# Patient Record
Sex: Female | Born: 1970
Health system: Southern US, Community
[De-identification: ages and names within clinical notes are randomized; demographics above are authoritative.]

## PROBLEM LIST (undated history)

## (undated) DIAGNOSIS — D649 Anemia, unspecified: Secondary | ICD-10-CM

## (undated) DIAGNOSIS — E669 Obesity, unspecified: Secondary | ICD-10-CM

## (undated) DIAGNOSIS — Z9889 Other specified postprocedural states: Secondary | ICD-10-CM

## (undated) DIAGNOSIS — K610 Anal abscess: Secondary | ICD-10-CM

## (undated) DIAGNOSIS — Z8719 Personal history of other diseases of the digestive system: Secondary | ICD-10-CM

## (undated) DIAGNOSIS — E282 Polycystic ovarian syndrome: Secondary | ICD-10-CM

## (undated) DIAGNOSIS — R112 Nausea with vomiting, unspecified: Secondary | ICD-10-CM

## (undated) DIAGNOSIS — K602 Anal fissure, unspecified: Secondary | ICD-10-CM

## (undated) DIAGNOSIS — I10 Essential (primary) hypertension: Secondary | ICD-10-CM

## (undated) HISTORY — DX: Polycystic ovarian syndrome: E28.2

## (undated) HISTORY — PX: OTHER SURGICAL HISTORY: SHX169

## (undated) HISTORY — PX: TUBAL LIGATION: SHX77

## (undated) HISTORY — PX: GASTRIC BYPASS: SHX52

## (undated) HISTORY — DX: Anemia, unspecified: D64.9

## (undated) HISTORY — PX: CHOLECYSTECTOMY: SHX55

## (undated) HISTORY — PX: TONSILLECTOMY: SUR1361

## (undated) HISTORY — DX: Personal history of other diseases of the digestive system: Z87.19

## (undated) HISTORY — DX: Anal fissure, unspecified: K60.2

## (undated) HISTORY — DX: Obesity, unspecified: E66.9

## (undated) HISTORY — PX: CARPAL TUNNEL RELEASE: SHX101

## (undated) HISTORY — DX: Anal abscess: K61.0

## (undated) HISTORY — PX: COSMETIC SURGERY: SHX468

## (undated) HISTORY — PX: NASAL SEPTUM SURGERY: SHX37

## (undated) HISTORY — PX: BREAST REDUCTION SURGERY: SHX8

## (undated) HISTORY — PX: EAR CYST EXCISION: SHX22

## (undated) SURGERY — Surgical Case
Anesthesia: *Unknown

---

## 1997-07-05 ENCOUNTER — Ambulatory Visit (HOSPITAL_BASED_OUTPATIENT_CLINIC_OR_DEPARTMENT_OTHER): Admission: RE | Admit: 1997-07-05 | Discharge: 1997-07-05 | Payer: Self-pay

## 1998-04-04 ENCOUNTER — Ambulatory Visit (HOSPITAL_COMMUNITY): Admission: RE | Admit: 1998-04-04 | Discharge: 1998-04-04 | Payer: Self-pay | Admitting: Obstetrics and Gynecology

## 1998-06-11 ENCOUNTER — Other Ambulatory Visit: Admission: RE | Admit: 1998-06-11 | Discharge: 1998-06-11 | Payer: Self-pay | Admitting: Obstetrics and Gynecology

## 1999-12-27 ENCOUNTER — Emergency Department (HOSPITAL_COMMUNITY): Admission: EM | Admit: 1999-12-27 | Discharge: 1999-12-27 | Payer: Self-pay | Admitting: Emergency Medicine

## 2000-02-18 ENCOUNTER — Other Ambulatory Visit: Admission: RE | Admit: 2000-02-18 | Discharge: 2000-02-18 | Payer: Self-pay | Admitting: Obstetrics and Gynecology

## 2001-05-26 ENCOUNTER — Other Ambulatory Visit: Admission: RE | Admit: 2001-05-26 | Discharge: 2001-05-26 | Payer: Self-pay | Admitting: Obstetrics and Gynecology

## 2001-06-26 ENCOUNTER — Encounter: Payer: Self-pay | Admitting: Emergency Medicine

## 2001-06-26 ENCOUNTER — Emergency Department (HOSPITAL_COMMUNITY): Admission: EM | Admit: 2001-06-26 | Discharge: 2001-06-26 | Payer: Self-pay | Admitting: Emergency Medicine

## 2002-09-11 ENCOUNTER — Other Ambulatory Visit: Admission: RE | Admit: 2002-09-11 | Discharge: 2002-09-11 | Payer: Self-pay | Admitting: Obstetrics and Gynecology

## 2003-05-14 ENCOUNTER — Emergency Department (HOSPITAL_COMMUNITY): Admission: EM | Admit: 2003-05-14 | Discharge: 2003-05-14 | Payer: Self-pay | Admitting: Emergency Medicine

## 2003-05-17 ENCOUNTER — Ambulatory Visit (HOSPITAL_COMMUNITY): Admission: RE | Admit: 2003-05-17 | Discharge: 2003-05-17 | Payer: Self-pay | Admitting: General Surgery

## 2003-05-20 ENCOUNTER — Encounter (INDEPENDENT_AMBULATORY_CARE_PROVIDER_SITE_OTHER): Payer: Self-pay | Admitting: Specialist

## 2003-05-20 ENCOUNTER — Observation Stay (HOSPITAL_COMMUNITY): Admission: RE | Admit: 2003-05-20 | Discharge: 2003-05-21 | Payer: Self-pay | Admitting: General Surgery

## 2004-02-17 ENCOUNTER — Ambulatory Visit: Payer: Self-pay | Admitting: Gastroenterology

## 2004-02-24 ENCOUNTER — Ambulatory Visit: Payer: Self-pay | Admitting: Gastroenterology

## 2004-03-26 ENCOUNTER — Ambulatory Visit: Payer: Self-pay | Admitting: Gastroenterology

## 2004-12-28 IMAGING — RF DG CHOLANGIOGRAM OPERATIVE
1 series · 2 of 2 positions shown · non-contrast
Comparison: none

CLINICAL DATA: Gallstones.
 OPERATIVE CHOLANGIOGRAM
 Injection is made through the cystic duct.  There is good visualization of the common hepatic and common bile ducts.  There is good excretion into the duodenum.  No filling defects are seen.  
 IMPRESSION
 Unremarkable operative cholangiogram.

[Series 1: run · 2 of 2 slices shown]
[im 1/2]
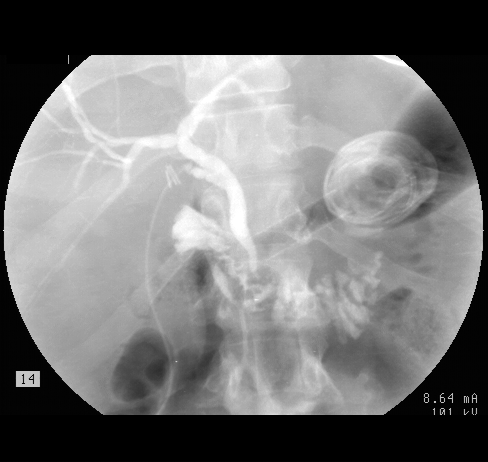
[im 2/2]
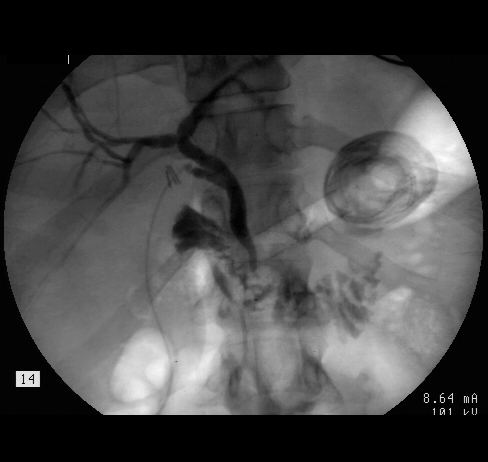

[2 of 2 positions shown; findings below may reference images not displayed]

## 2007-06-04 ENCOUNTER — Emergency Department (HOSPITAL_COMMUNITY): Admission: EM | Admit: 2007-06-04 | Discharge: 2007-06-04 | Payer: Self-pay | Admitting: Emergency Medicine

## 2008-05-02 ENCOUNTER — Observation Stay (HOSPITAL_COMMUNITY): Admission: EM | Admit: 2008-05-02 | Discharge: 2008-05-03 | Payer: Self-pay | Admitting: Emergency Medicine

## 2008-05-02 ENCOUNTER — Ambulatory Visit: Payer: Self-pay | Admitting: Cardiology

## 2008-05-08 ENCOUNTER — Ambulatory Visit: Payer: Self-pay

## 2008-05-28 ENCOUNTER — Ambulatory Visit: Payer: Self-pay | Admitting: Cardiology

## 2008-05-28 ENCOUNTER — Encounter: Payer: Self-pay | Admitting: Cardiology

## 2008-07-29 ENCOUNTER — Ambulatory Visit (HOSPITAL_BASED_OUTPATIENT_CLINIC_OR_DEPARTMENT_OTHER): Admission: RE | Admit: 2008-07-29 | Discharge: 2008-07-29 | Payer: Self-pay | Admitting: Orthopedic Surgery

## 2009-08-20 ENCOUNTER — Ambulatory Visit (HOSPITAL_COMMUNITY): Admission: RE | Admit: 2009-08-20 | Discharge: 2009-08-21 | Payer: Self-pay | Admitting: Neurosurgery

## 2010-02-12 ENCOUNTER — Emergency Department (HOSPITAL_COMMUNITY): Admission: EM | Admit: 2010-02-12 | Discharge: 2009-07-15 | Payer: Self-pay | Admitting: Emergency Medicine

## 2010-05-25 LAB — CBC
HCT: 37.2 % (ref 36.0–46.0)
Hemoglobin: 12.2 g/dL (ref 12.0–15.0)
MCHC: 32.8 g/dL (ref 30.0–36.0)
MCV: 83.9 fL (ref 78.0–100.0)
Platelets: 349 10*3/uL (ref 150–400)
RBC: 4.43 MIL/uL (ref 3.87–5.11)
RDW: 13.7 % (ref 11.5–15.5)
WBC: 9.5 10*3/uL (ref 4.0–10.5)

## 2010-05-25 LAB — MRSA CULTURE

## 2010-05-25 LAB — BASIC METABOLIC PANEL
BUN: 10 mg/dL (ref 6–23)
CO2: 30 mEq/L (ref 19–32)
Calcium: 9.8 mg/dL (ref 8.4–10.5)
Chloride: 98 mEq/L (ref 96–112)
Creatinine, Ser: 0.71 mg/dL (ref 0.4–1.2)
GFR calc Af Amer: >60 mL/min (ref >60)
GFR calc non Af Amer: >60 mL/min (ref >60)
Glucose, Bld: 94 mg/dL (ref 70–99)
Potassium: 3.7 mEq/L (ref 3.5–5.1)
Sodium: 135 mEq/L (ref 135–145)

## 2010-05-25 LAB — SURGICAL PCR SCREEN: MRSA, PCR: INVALID — AB

## 2010-05-26 LAB — POCT PREGNANCY, URINE: Preg Test, Ur: NEGATIVE

## 2010-06-16 LAB — BASIC METABOLIC PANEL
CO2: 31 mEq/L (ref 19–32)
Calcium: 9.2 mg/dL (ref 8.4–10.5)
Creatinine, Ser: 0.65 mg/dL (ref 0.4–1.2)
GFR calc Af Amer: >60 mL/min (ref >60)
GFR calc non Af Amer: >60 mL/min (ref >60)
Sodium: 140 mEq/L (ref 135–145)

## 2010-06-23 LAB — DIFFERENTIAL
Basophils Absolute: 0.1 10*3/uL (ref 0.0–0.1)
Basophils Relative: 1 % (ref 0–1)
Eosinophils Absolute: 0.2 10*3/uL (ref 0.0–0.7)
Lymphocytes Relative: 23 % (ref 12–46)
Lymphs Abs: 2.7 10*3/uL (ref 0.7–4.0)
Monocytes Absolute: 0.9 10*3/uL (ref 0.1–1.0)
Neutrophils Relative %: 67 % (ref 43–77)

## 2010-06-23 LAB — POCT CARDIAC MARKERS
Myoglobin, poc: 50.1 ng/mL (ref 12–200)
Troponin i, poc: <0.05 ng/mL (ref 0.00–0.09)

## 2010-06-23 LAB — CK TOTAL AND CKMB (NOT AT ARMC)
Relative Index: 0.6 (ref 0.0–2.5)
Total CK: 107 U/L (ref 7–177)

## 2010-06-23 LAB — CBC
HCT: 38 % (ref 36.0–46.0)
MCV: 83.5 fL (ref 78.0–100.0)
Platelets: 357 10*3/uL (ref 150–400)
RBC: 4.54 MIL/uL (ref 3.87–5.11)
RDW: 14.2 % (ref 11.5–15.5)
WBC: 11.8 10*3/uL — ABNORMAL HIGH (ref 4.0–10.5)

## 2010-06-23 LAB — URINALYSIS, ROUTINE W REFLEX MICROSCOPIC
Glucose, UA: NEGATIVE mg/dL
Ketones, ur: NEGATIVE mg/dL
Nitrite: NEGATIVE
Specific Gravity, Urine: 1.023 (ref 1.005–1.030)
Urobilinogen, UA: 0.2 mg/dL (ref 0.0–1.0)
pH: 7.5 (ref 5.0–8.0)

## 2010-06-23 LAB — HEMOGLOBIN A1C: Mean Plasma Glucose: 123 mg/dL

## 2010-06-23 LAB — CARDIAC PANEL(CRET KIN+CKTOT+MB+TROPI)
CK, MB: 0.6 ng/mL (ref 0.3–4.0)
Relative Index: INVALID (ref 0.0–2.5)
Troponin I: <0.01 ng/mL (ref 0.00–0.06)
Troponin I: <0.01 ng/mL (ref 0.00–0.06)

## 2010-06-23 LAB — POCT PREGNANCY, URINE: Preg Test, Ur: NEGATIVE

## 2010-06-23 LAB — POCT I-STAT, CHEM 8
Chloride: 105 mEq/L (ref 96–112)
Glucose, Bld: 95 mg/dL (ref 70–99)
HCT: 39 % (ref 36.0–46.0)
Potassium: 3.6 mEq/L (ref 3.5–5.1)
Sodium: 138 mEq/L (ref 135–145)

## 2010-06-23 LAB — URINE MICROSCOPIC-ADD ON

## 2010-06-23 LAB — TSH: TSH: 0.95 u[IU]/mL (ref 0.350–4.500)

## 2010-07-21 NOTE — Discharge Summary (Signed)
NAME:  Denise Roth, Denise Roth                ACCOUNT NO.:  [id number]   MEDICAL RECORD NO.:  [id number]          PATIENT TYPE:  OBV   LOCATION:  4707                         FACILITY:  [facility]   PHYSICIAN:  Madolyn Frieze. Jens Som, MD, FACCDATE OF BIRTH:  1970-07-09   DATE OF ADMISSION:  05/02/2008  DATE OF DISCHARGE:  05/03/2008                               DISCHARGE SUMMARY   PRIMARY CARDIOLOGIST:  Luis Abed, MD, [facility]   PRIMARY CARE Catricia Scheerer:  The patient uses [facility].   [name] is being discharged home with a diagnosis of chest pain, felt  to be noncardiac, most likely musculoskeletal, ruled out for myocardial  infarction by serial cardiac enzymes and 12-lead EKG.   PAST MEDICAL HISTORY:  Hypertension.   [name] was admitted on May 02, 2008, for chest pain.  A 12-lead  EKG showed no acute findings.  Initial set of lab work and cardiac  enzymes negative.  [name] has a history of hypertension and left  sciatica status post lap cholecystectomy, states compliance with her  hydrochlorothiazide, has a strong family history of premature CAD with  father passed away at age 83 from an MI, underwent bypass in his 80s.  On admission, the patient complained of chest discomfort, a sudden  onset, described as pressure, 5-7/10 in intensity while at work before a  meeting.  Pain waxed and waned but did not go completely away and lasted  for approximately 1 hour.  Pain was substernal, pressure like,  associated with diaphoresis, shortness of breath and relieved by  nitroglycerin, given by EMS.  No previous episodes of chest discomfort.  The patient was admitted for observation, continued her  hydrochlorothiazide, enzymes negative.  The patient is being discharged  home after seen by Dr. Jens Som.  Scheduled her for stress Myoview at  our office on May 08, 2008, at 8:15.  She will be at [employer]  secondary to her weight, Dr. Myrtis Ser on May 28, 2008, at 10:30.  The  patient will continue her hydrochlorothiazide 25 mg daily.   DURATION OF DISCHARGE ENCOUNTER:  Over 30 minutes secondary to discharge  preparation work.      Dorian Pod, ACNP      Madolyn Frieze. Jens Som, MD, [facility]  Electronically Signed    MB/MEDQ  D:  05/03/2008  T:  05/03/2008  Job:  [id number]

## 2010-07-21 NOTE — H&P (Signed)
NAME:  Denise Roth                ACCOUNT NO.:  [id number]   MEDICAL RECORD NO.:  [id number]          PATIENT TYPE:  OBV   LOCATION:  1833                         FACILITY:  [facility]   PHYSICIAN:  Luis Abed, MD, FACCDATE OF BIRTH:  10-19-1970   DATE OF ADMISSION:  05/02/2008  DATE OF DISCHARGE:                              HISTORY & PHYSICAL   TIME:  1645.   Patient does not have a primary care physician, only uses [facility].   CHIEF COMPLAINT:  Chest pain.   HISTORY OF PRESENT ILLNESS:  Denise Roth is a 40 year old woman with a past  medical history significant for hypertension, family history of  premature coronary artery disease on her father's side, who presents to  the emergency department with chest pain that started this morning.  Patient states that she had the sudden onset of chest pain described as  pressure, 5-7/10 in intensity while at work before a meeting.  The pain  waxed and waned but did not go away completely and lasted for  approximately 1 hour.  The pain was substernal, pressure-like,  associated with diaphoresis, shortness of breath, and relieved by  nitroglycerin, given to her by the EMS.  She states that she has never  had any similar episodes.  There were no aggravating factors.  Furthermore, she denies any fever, cough, pleurisy, leg swelling, calf  pain, or orthopnea or dyspnea on exertion.   PAST MEDICAL HISTORY:  1. Hypertension.  2. Left sciatica.   SURGICAL HISTORY:  Laparoscopic cholecystectomy for cholelithiasis and  cholecystitis.   ALLERGIES:  No known drug allergies.   MEDICATIONS:  HCTZ 25 mg p.o. daily.   Patient lives in [place] with her husband and 1 daughter.  She is  married with 2 children.  The older son is currently away in college.  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  She  denies any tobacco use.  Only has occasional alcohol use and denies any  illicit drug use.   FAMILY  HISTORY:  Father passed away at age 40 from an MI.  He apparently  had approximately 3 MI's, first in his early-to-mid 54's and underwent  CABG in his 62's.  Father had multiple siblings, all of which have had  CABG and/or MI's between the ages of 40 and 81.  Mother is alive at age  53 with hypertension and no heart disease, per patient's knowledge.   REVIEW OF SYSTEMS:  Per HPI and positive for headache during this  episode as well as palpitations.  All other systems reviewed and  negative.   PHYSICAL EXAMINATION:  Temperature 98.3, pulse 77, respirations 20,  blood pressure 130/84.  Oxygen saturation 98% on 2 liters.  GENERAL:  She is an obese-appearing African American woman in no acute  distress.  HEENT:  Pupils are equal, round and reactive to light and accommodation.  Extraocular movements are intact.  Sclerae are clear.  Moist mucous  membranes.  Oropharynx is clear without any erythema or exudate.  NECK:  Supple without any lymphadenopathy, thyromegaly, bruits, or  JVD.  LYMPHATICS:  No cervical lymphadenopathy.  CARDIOVASCULAR:  S1 and S2 with a regular rate and rhythm.  No gallops  or rubs.  The point of maximal impression is within normal limits.  She  does have a grade 1/6 systolic murmur of her outflow tract.  Pulses are  2+ and equal without any bruits.  LUNGS:  Clear to auscultation bilaterally without any crackles, wheezes,  or rhonchi.  SKIN:  There are no rashes. She does have a tattoo in her mid upper  back.  ABDOMEN:  Soft, nontender, nondistended with normoactive bowel sounds.  There is no rebound or guarding.  No hepatosplenomegaly.  GU:  There is no CVA tenderness.  EXTREMITIES:  No clubbing, cyanosis or edema.  MUSCULOSKELETAL:  There is no joint deformity, effusions, or tenderness.  NEURO:  She is awake, alert and oriented x3.  Cranial nerves II-XII are  grossly intact.  Strength is 5/5 x4 extremities.  Sensation is normal  throughout.   RADIOLOGY:  Chest  x-ray shows no active disease.   Electrocardiogram shows a rate of 86 with sinus rhythm, normal axis.  Intervals:  P-R 148, QRS 76, QTC 469.  There are no Q waves or ST/T  changes.  Otherwise normal.  There is no prior EKG for comparison.   LABS:  WBCs 11.8, hemoglobin 12.7, hematocrit 38, platelets 357,000.  Sodium 138, potassium 3.6, chloride 105, bicarb 26, BUN 10, creatinine  0.8, glucose 95.  Urine pregnancy test is negative.  Urinalysis has a  small amount of leukocyte esterase, 7-10 WBCs, many bacteria but is also  demonstrating many squamous epithelial cells, consistent with  contamination.  Point-of-care cardiac markers:  CK-MB less than 1.  Troponin I less than 0.05 and myoglobin of 50.   ASSESSMENT/PLAN:  Denise Roth is a 40 year old woman with a past medical  history significant for hypertension and a family history of premature  coronary artery disease/myocardial infarctions.  She presents with chest  pain to the emergency department.  Given her risk factors, will admit to  telemetry and observe her overnight, rule out acute coronary syndrome  with serial cardiac enzymes and an electrocardiogram.  Her initial  electrocardiogram is normal with negative point-of-care cardiac markers.  For now, we will treat with aspirin and continue her home dose of  hydrochlorothiazide for blood pressure.  Assume patient will rule out  and if so, will schedule for exercise stress  Myoview as an outpatient.  Otherwise, if patient rules in, unlikely will  treat for acute coronary syndrome.  Will also risk stratify with a TSH  fasting lipid panel and hemoglobin A1C.  Other etiologies of chest pain  have been ruled out, including pneumonia, pneumothorax, aortic  dissection, etc.  The case will be discussed with Dr. Myrtis Ser.      Denise Stable, MD  Electronically Signed      Luis Abed, MD, [facility]  Electronically Signed    MA/MEDQ  D:  05/02/2008  T:  05/02/2008  Job:  [id number]

## 2010-07-21 NOTE — Assessment & Plan Note (Signed)
[facility]                            CARDIOLOGY OFFICE NOTE   NAME:Roth, Denise BARBARY                       MRN:          [id number]  DATE:05/28/2008                            DOB:          04-28-1970    Denise Roth was recently hospitalized.  She had chest pain.  She had no MI  and she was discharged from the hospital.  Plans were made for her to  have a followup outpatient exercise test in our office.  This was done  on May 08, 2008.  Study showed that she walked on the treadmill.  There  was no EKG change.  Wall motion was normal.  There was some decreased  counts in the anterior wall.  This is from breast attenuation shift.  There was no significant abnormality.   Today back in the office, she has been stable.  She has not had any  significant recurring chest pain.   PAST MEDICAL HISTORY:   ALLERGIES:  See the EMR.   MEDICATIONS:  See the EMR.   REVIEW OF SYSTEMS:  She has no fevers or chills.  She has not had any  headaches.  There are no GI or GU symptoms.  Her review of systems  otherwise is negative.   PHYSICAL EXAMINATION:  VITAL SIGNS:  Today, blood pressure is 100/60  with a pulse of 87.  GENERAL:  The patient is oriented to person, time, and place.  Affect is  normal.  HEENT:  No xanthelasma.  She has normal extraocular motion.  NECK:  There are no carotid bruits.  There is no jugular venous  distention.  LUNGS:  Clear.  Respiratory effort is not labored.  CARDIAC:  S1 and S2.  There are no clicks or significant murmurs.  ABDOMEN:  Soft.  EXTREMITIES:  She has no significant peripheral edema.   I have reviewed the nuclear exercise test with her and I told her that I  feel that there is no evidence of significant cardiac disease.   PROBLEMS:  #1.  Chest discomfort.  This is not cardiac in origin.  No  further cardiac workup is needed.    Luis Abed, MD, [facility]  Electronically Signed   JDK/MedQ  DD: 05/28/2008  DT:  05/29/2008  Job #: [id number]   cc:   [facility]

## 2010-07-24 NOTE — Op Note (Signed)
NAME:  Denise Roth, Denise Roth                ACCOUNT NO.:  [id number]   MEDICAL RECORD NO.:  [id number]          PATIENT TYPE:  AMB   LOCATION:  DSC                          FACILITY:  [facility]   PHYSICIAN:  Feliberto Gottron. Turner Daniels, M.D.   DATE OF BIRTH:  [birth year]   DATE OF PROCEDURE:  07/30/2008  DATE OF DISCHARGE:                               OPERATIVE REPORT   PREOPERATIVE DIAGNOSIS:  Bilateral carpal tunnel syndrome.   POSTOPERATIVE DIAGNOSIS:  Bilateral carpal tunnel syndrome.   PROCEDURE:  Bilateral carpal tunnel release.   SURGEON:  Feliberto Gottron. Turner Daniels, MD   FIRST ASSISTANT:  Shirl Harris, PA-C   ANESTHETIC:  General LMA.   ESTIMATED BLOOD LOSS:  Minimal.   FLUID REPLACEMENT:  500 mL of crystalloid.   DRAINS PLACED:  None.   TOURNIQUET TIME:  None.   INDICATIONS FOR PROCEDURE:  A 40 year old woman with EMG proven  bilateral moderate-to-severe carpal tunnel syndrome, has failed  conservative treatment, anti-inflammatory medicines, and wanting an  observation.  She desires bilateral carpal tunnel release to decrease  pain and increase function and I want certainly done both at the same  time because of business concerns, this has been discussed at length  with air and she does have some one who is willing to take care of her  personal needs for the next week or so.  Risks and benefits of surgery  was discussed, questions answered.   DESCRIPTION OF PROCEDURE:  The patient was identified by armband and  taken to the operating room at [facility].  Appropriate  anesthetic monitors were attached and general LMA anesthesia was induced  with the patient in supine position.  Bilateral forearm tourniquets were  applied and both hands, wrists, and distal forearms were prepped and  draped in usual sterile fashion from the fingertips to the tourniquet.  A standard time-out procedure was then performed, and the left upper  extremity was then a exsanguinated using an Esmarch bandage  from the  fingertips to the tourniquet, which was inflated to 250 mmHg, I began  the operation itself from a left side by making a longitudinal incision  starting at the wrist flexion crease and going distally just 2 mm of the  ulnar side of the thenar crease through the skin and subcutaneous tissue  distally.  We identified the transverse palmar fascia made a small  incision under tension entering the carpal tunnel.  Under direct vision,  and traction applied medially and laterally.  We placed a Freer elevator  volarly in the tunnel and cut down on the [place] accomplishing the  release of the transverse carpal ligament.  This release has run up into  the forearm passed through the black-handled scissors again under direct  vision through the 3 cm wound.  At this port, we explored the contents  of the carpal tunnel.  There were no ganglia or masses.  The median  nerve was noted to have an hourglass configuration as the one under the  transverse carpal ligament and no lacerations were noted.  The wound was  then irrigated out  with normal saline solution and the skin only closed  with running 5-0 nylon suture.  We then directed our attention to the  right upper extremity and likewise exsanguinated and inflated the  tourniquet to 250 mmHg and made a similar longitudinal incision.  Again,  starting at the wrist flexion crease and going distally through the skin  and subcutaneous tissue, we then entered carpal tunnel distally and  performed a carpal tunnel release.  Once again, the right carpal tunnel  was then explored.  No ganglia or masses and irrigated out with normal  saline solution.  On the right side, we then closed with a running 5-0  nylon suture and both sides then had a dressing of Xeroform 4 x 4  dressing, sponges, 2-inch Webril, and 10-inch Ace wrap.  The tourniquets  were let down, the hands pinked up.  The patient was then awakened,  extubated, and taken to recovery room without  difficulty.      Feliberto Gottron. Turner Daniels, M.D.  Electronically Signed     FJR/MEDQ  D:  07/29/2008  T:  07/30/2008  Job:  [id number]

## 2010-07-24 NOTE — Op Note (Signed)
[name], [name]                         ACCOUNT NO.:  [id number]   MEDICAL RECORD NO.:  [id number]                   PATIENT TYPE:  OBV   LOCATION:  0445                                 FACILITY:  [facility]   PHYSICIAN:  Sharlet Salina T. Hoxworth, M.D.          DATE OF BIRTH:  Nov 14, 1970   DATE OF PROCEDURE:  05/20/2003  DATE OF DISCHARGE:                                 OPERATIVE REPORT   PREOPERATIVE DIAGNOSES:  Cholelithiasis and cholecystitis.   POSTOPERATIVE DIAGNOSES:  Cholelithiasis and cholecystitis.   SURGICAL PROCEDURE:  Laparoscopic cholecystectomy with intraoperative  cholangiogram.   SURGEON:  Dr. Johna Sheriff.   ASSISTANT:  Dr. Leonie Man.   ANESTHESIA:  General.   BRIEF HISTORY:  Denise Roth is a 40 year old black female with recurrent, more  frequent and severe episodic epigastric and right upper quadrant abdominal  pain.  Work-up has included a gallbladder ultrasound showing small  gallstones.  She has had mildly elevated LFTs on a recent evaluation.  Common bile duct was normal caliber on ultrasound.  Laparoscopic  cholecystectomy with cholangiogram has been recommended and accepted.  The  nature of the procedure, indications, risks of bleeding, infection, bile  leak, bile duct injury, and possible need for open procedure were discussed  and understood.  She is now brought to the operating room for this  procedure.   DESCRIPTION OF OPERATION:  The patient brought to the operating room and  placed in the supine position on the operating table, and general  endotracheal anesthesia was induced.  She received preoperative antibiotics.  PAS were in place.  The abdomen was widely sterilely prepped and draped.  Local anesthesia was used to infiltrate the trocar sites prior to the  incisions.  A 1 cm incision was made in the umbilicus and dissection carried  down to midline fascia which was sharply incised for 1 cm, and the  peritoneum entered under direct vision.   Through a mattress suture of 3-0  Vicryl, the Hasson trocar was placed and pneumoperitoneum established.  Standard four port technique was used.  The gallbladder was visualized and  had some omental adhesions but was not acutely inflamed.  The fundus was  grasped and the omental adhesions taken down with cautery and blunt  dissection.  The infundibulum was exposed and retracted inferolaterally.  Fibrofatty tissue was stripped off of the neck of the gallbladder toward the  porta hepatis __________  .  The cystic duct was identified and dissected  free and the cystic duct gallbladder junction dissected 360 degrees.  The  cystic artery was identified posterior __________  to the cystic duct was  then clipped at the gallbladder junction, and operative cholangiogram was  obtained through the cystic duct, and this showed good filling of the normal  size common bile duct and intrahepatic ducts with free flow into the  duodenum and no filling defects.  Following this, the Cholangiocath was  removed, and the cystic  duct was doubly clipped  proximally and divided.  The cystic artery anterior and posterior branches were divided between  clips. The gallbladder was then dissected free from its bed using hook  cautery.  __________  umbilicus.  The right upper quadrant was irrigated and  complete hemostasis assured.  Trocars were removed under direct vision.  All  CO2 was  evacuated from the peritoneal cavity.  The mattress suture was secured at  the umbilicus.  Skin incisions were closed with subcuticular 4-0 Monocryl  and Steri-Strips.  Sponge, needle, and instrument counts were correct.  Dry  sterile dressings were applied and the patient taken to recovery in good  condition.                                               Lorne Skeens. Hoxworth, M.D.    Tory Emerald  D:  05/20/2003  T:  05/20/2003  Job:  [id number]

## 2010-08-10 ENCOUNTER — Other Ambulatory Visit: Payer: Self-pay | Admitting: Obstetrics and Gynecology

## 2010-11-30 LAB — URINE MICROSCOPIC-ADD ON

## 2010-11-30 LAB — URINALYSIS, ROUTINE W REFLEX MICROSCOPIC

## 2011-03-31 IMAGING — CR DG OR PORTABLE SPINE
1 series · 1 of 1 positions shown · non-contrast
Comparison: Lumbar spine radiographs 07/15/2009

CLINICAL DATA: L4-5 laminectomy.

PORTABLE SPINE

[view not recorded]
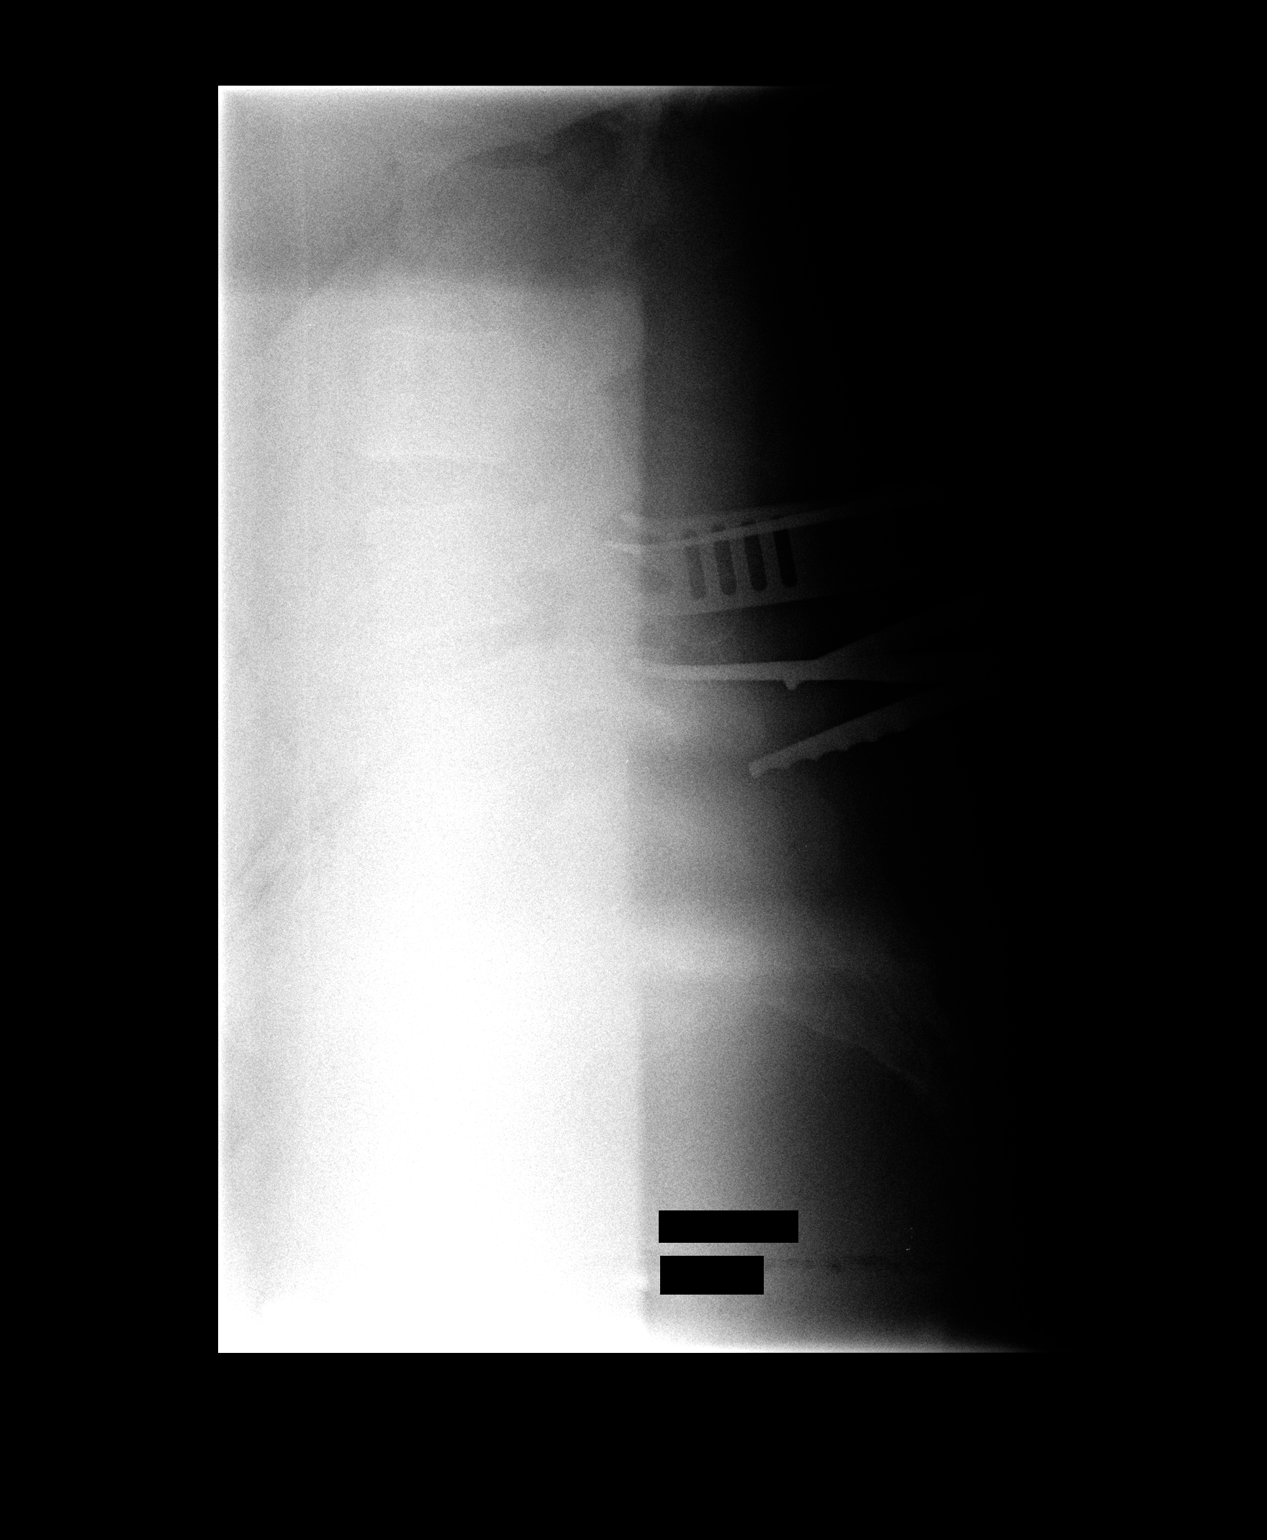

[1 of 1 positions shown; findings below may reference images not displayed]

FINDINGS: A single lateral film is submitted.  A fainter surgical
probe is directed at the posterior pedicles of L4.  A slightly
thicker probe is at the level of the L5 pedicles.
IMPRESSION: Single lateral view for localization as described above.  The
findings were called to Dr. Rudi in the operating room at [DATE]
p.m. 08/20/2009.

## 2011-03-31 IMAGING — CR DG LUMBAR SPINE 1V
1 series · 1 of 1 positions shown · non-contrast
Comparison: 07/15/2009.

CLINICAL DATA: L4-5 laminectomy and microdiskectomy.  Lumbar HNP.

LUMBAR SPINE - 1 VIEW

[view not recorded]
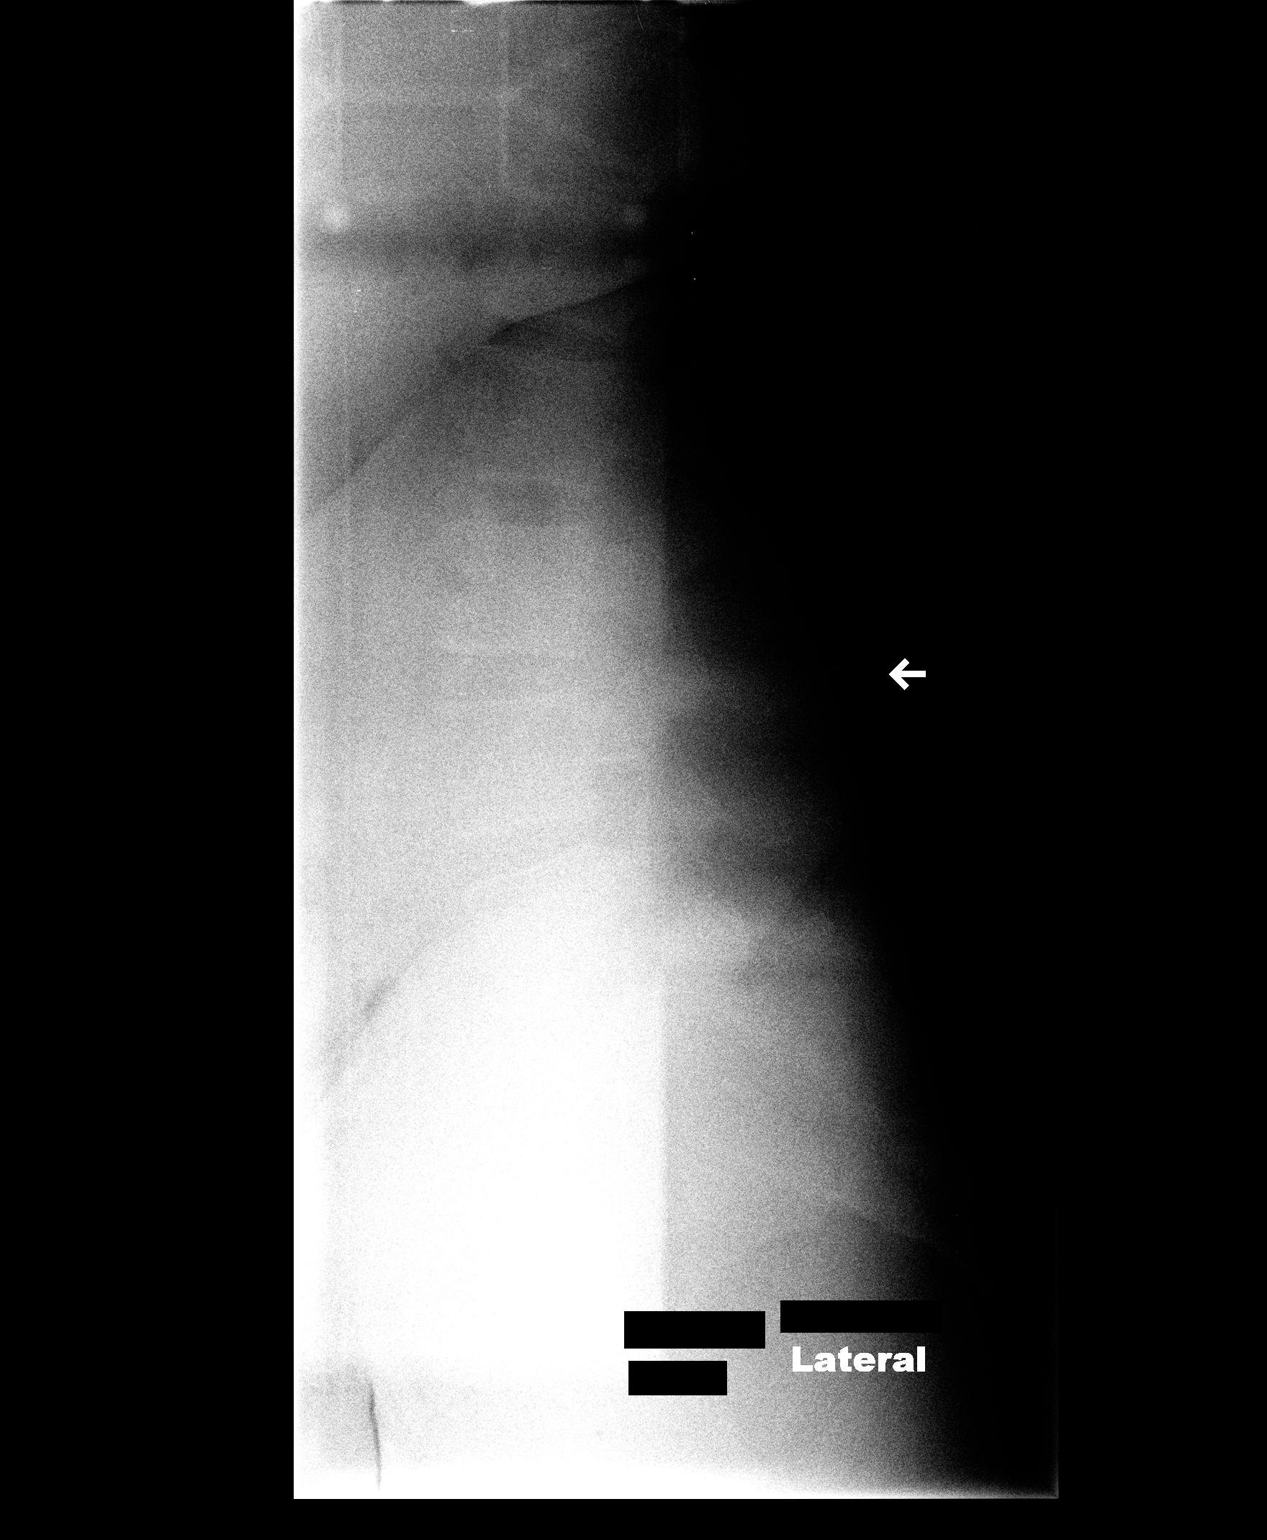

[1 of 1 positions shown; findings below may reference images not displayed]

FINDINGS: A single portable lateral view of the lumbar spine in the
operating room is limited due to patient body habitus.  The needle
is not visualized.  However, a marker indicating the level of the
needle is directed at the L3-4 disc space.
IMPRESSION: Intraoperative localization of the L3-4 disc space.

## 2011-06-14 ENCOUNTER — Other Ambulatory Visit: Payer: Self-pay | Admitting: Obstetrics and Gynecology

## 2011-06-14 DIAGNOSIS — Z1231 Encounter for screening mammogram for malignant neoplasm of breast: Secondary | ICD-10-CM

## 2011-06-25 ENCOUNTER — Ambulatory Visit: Payer: Self-pay

## 2011-07-09 ENCOUNTER — Ambulatory Visit: Payer: Self-pay

## 2011-09-09 ENCOUNTER — Emergency Department (HOSPITAL_COMMUNITY)
Admission: EM | Admit: 2011-09-09 | Discharge: 2011-09-09 | Disposition: A | Discharge status: Home Health | Payer: 59 | Attending: Emergency Medicine | Admitting: Emergency Medicine

## 2011-09-09 ENCOUNTER — Encounter (HOSPITAL_COMMUNITY): Payer: Self-pay | Admitting: *Deleted

## 2011-09-09 ENCOUNTER — Emergency Department (HOSPITAL_COMMUNITY): Payer: 59

## 2011-09-09 DIAGNOSIS — Z79899 Other long term (current) drug therapy: Secondary | ICD-10-CM | POA: Insufficient documentation

## 2011-09-09 DIAGNOSIS — K6289 Other specified diseases of anus and rectum: Secondary | ICD-10-CM | POA: Insufficient documentation

## 2011-09-09 DIAGNOSIS — I10 Essential (primary) hypertension: Secondary | ICD-10-CM | POA: Insufficient documentation

## 2011-09-09 DIAGNOSIS — K644 Residual hemorrhoidal skin tags: Secondary | ICD-10-CM | POA: Insufficient documentation

## 2011-09-09 DIAGNOSIS — R197 Diarrhea, unspecified: Secondary | ICD-10-CM | POA: Insufficient documentation

## 2011-09-09 DIAGNOSIS — K625 Hemorrhage of anus and rectum: Secondary | ICD-10-CM | POA: Insufficient documentation

## 2011-09-09 DIAGNOSIS — K649 Unspecified hemorrhoids: Secondary | ICD-10-CM

## 2011-09-09 HISTORY — DX: Essential (primary) hypertension: I10

## 2011-09-09 LAB — POCT I-STAT, CHEM 8
BUN: 8 mg/dL (ref 6–23)
Chloride: 104 mEq/L (ref 96–112)
Glucose, Bld: 81 mg/dL (ref 70–99)
HCT: 32 % — ABNORMAL LOW (ref 36.0–46.0)
Potassium: 3.3 mEq/L — ABNORMAL LOW (ref 3.5–5.1)

## 2011-09-09 MED ORDER — HYDROCORTISONE 2.5 % RE CREA: TOPICAL_CREAM | RECTAL | Status: AC

## 2011-09-09 MED ORDER — OXYCODONE-ACETAMINOPHEN 5-325 MG PO TABS
2.0000 | ORAL_TABLET | Freq: Once | ORAL | Status: AC
  Administered 2011-09-09: 2 via ORAL
  Filled 2011-09-09: qty 2

## 2011-09-09 MED ORDER — IOHEXOL 300 MG/ML  SOLN
80.0000 mL | Freq: Once | INTRAMUSCULAR | Status: AC | PRN
  Administered 2011-09-09: 80 mL via INTRAVENOUS

## 2011-09-09 MED ORDER — OXYCODONE-ACETAMINOPHEN 5-325 MG PO TABS: 2.0000 | ORAL_TABLET | ORAL | Status: AC | PRN

## 2011-09-09 NOTE — ED Provider Notes (Signed)
History   This chart was scribed for Glynn Octave, MD by Toya Smothers. The patient was seen in room TR07C/TR07C. Patient's care was started at 1649.  CSN: [id number]  Arrival date & time 09/09/11  1649   First MD Initiated Contact with Patient 09/09/11 1705      Chief Complaint  Patient presents with  . rectal pain    The history is provided by the patient. No language interpreter was used.    Denise Roth is a 41 y.o. female who presents to the Emergency Department complaining of gradual onset moderate severe worsening rectal pain described as burning. Pt reports that she has been having hemorroids for the past month, and that they have worsened in the past 3 days. Associated symptoms include diarrhea and blood when whipping. She has been seeing Dr. Reyes Ivan for treatment, and was completed prescribed ointment without relief. She denies fever and emesis. Pt lists a recent surgical h/o gastric bypass in August 2012.   Pt lists PCP as Dr. Reyes Ivan  Past Medical History  Diagnosis Date  . Hypertension   . History of tubal ligation     Past Surgical History  Procedure Date  . Gastric bypass     No family history on file.  History  Substance Use Topics  . Smoking status: Never Smoker   . Smokeless tobacco: Not on file  . Alcohol Use: No    Review of Systems  Constitutional: Negative for fever.  HENT: Negative for rhinorrhea.   Eyes: Negative for pain.  Respiratory: Negative for cough and shortness of breath.   Cardiovascular: Negative for chest pain.  Gastrointestinal: Positive for diarrhea, anal bleeding and rectal pain. Negative for nausea, vomiting, abdominal pain and blood in stool.  Genitourinary: Negative for dysuria.  Musculoskeletal: Negative for back pain.  Skin: Negative for rash.  Neurological: Negative for weakness and headaches.    Allergies  Review of patient's allergies indicates no known allergies.  Home Medications   Current Outpatient Rx  Name Route  Sig Dispense Refill  . DOCUSATE SODIUM 100 MG PO CAPS Oral Take 100 mg by mouth 2 (two) times daily.    Marland Kitchen HYDROCORTISONE 2.5 % RE CREA Rectal Place 1 application rectally 2 (two) times daily.    Marland Kitchen OMEPRAZOLE 40 MG PO CPDR Oral Take 40 mg by mouth daily.    Marland Kitchen LISINOPRIL-HYDROCHLOROTHIAZIDE 20-25 MG PO TABS Oral Take 1 tablet by mouth daily.      BP 106/64  Pulse 68  Temp 98.4 F (36.9 C) (Oral)  Resp 14  SpO2 100%  Physical Exam  Constitutional: She is oriented to person, place, and time. She appears well-developed and well-nourished. No distress.  HENT:  Head: Normocephalic and atraumatic.  Mouth/Throat: No oropharyngeal exudate.  Eyes: Conjunctivae are normal. Pupils are equal, round, and reactive to light.  Neck: Normal range of motion. Neck supple.  Cardiovascular: Normal rate, regular rhythm and normal heart sounds.   No murmur heard. Pulmonary/Chest: Effort normal and breath sounds normal. No respiratory distress.  Abdominal: Soft. There is no tenderness. There is no rebound and no guarding.  Genitourinary:       Chaperone present External hemorrhoid at 6 oclock position, nonthrombosed. No fissures Diffusely painful internal rectal exam without fluctuance. Some gross blood.  Musculoskeletal: Normal range of motion. She exhibits no edema and no tenderness.  Neurological: She is alert and oriented to person, place, and time. No cranial nerve deficit.  Skin: Skin is warm.    ED  Course  Procedures (including critical care time) DIAGNOSTIC STUDIES: Oxygen Saturation is 100% on room air, normal by my interpretation.    COORDINATION OF CARE: 1709- Evaluated Pt's present illness.   Labs Reviewed  POCT I-STAT, CHEM 8 - Abnormal; Notable for the following:    Potassium 3.3 (*)     Hemoglobin 10.9 (*)     HCT 32.0 (*)     All other components within normal limits   Ct Pelvis W Contrast  09/09/2011  *RADIOLOGY REPORT*  Clinical Data:  Rectal pain with hemorrhoids for past  month.  Pain worse for past 2 days.  CT PELVIS WITH CONTRAST  Technique:  Multidetector CT imaging of the pelvis was performed using the standard protocol following the bolus administration of intravenous contrast.  Contrast: 80mL OMNIPAQUE IOHEXOL 300 MG/ML  SOLN  Comparison:   None.  Findings:  No extraluminal bowel inflammatory process or free fluid in the pelvis.  Bilateral hip joint degenerative changes with subchondral cystic changes of the acetabulum.  L4-5 disc degeneration with osteophyte greater on the right.  Uterus tilted to the right.  Small calcification adjacent to the appendix without surrounding inflammation.  Noncontrast filled under distended views of the urinary bladder unremarkable.  IMPRESSION: No extraluminal bowel inflammatory process or free fluid in the pelvis.  Bilateral hip joint degenerative changes with subchondral cystic changes of the acetabulum.  L4-5 disc degeneration with osteophyte greater on the right.  Original Report Authenticated By: Fuller Canada, M.D.     No diagnosis found.    MDM  Rectal pain for the past month worse in the past 3 days not relieved by Anusol. Abdomen soft and nontender, no fevers or vomiting. Some blood with wiping.   Stable hemoglobin  No evidence of thrombosed hemorrhoid. No perirectal abscess.  Pain control, anusol, follow up with GI/surgery.   Date: 09/09/2011  Rate: 66  Rhythm: normal sinus rhythm  QRS Axis: normal  Intervals: normal  ST/T Wave abnormalities: normal  Conduction Disutrbances:none  Narrative Interpretation:   Old EKG Reviewed: unchanged    I personally performed the services described in this documentation, which was scribed in my presence.  The recorded information has been reviewed and considered.       Glynn Octave, MD 09/10/11 Ebony Cargo

## 2011-09-09 NOTE — ED Notes (Signed)
Old and new EKG handed to Dr. Manus Gunning.  Extra copy placed in pt chart

## 2011-09-09 NOTE — ED Notes (Signed)
Rectal pain with hemorrhoids for the past month and worse pain for 2-3 days

## 2011-10-11 ENCOUNTER — Telehealth: Payer: Self-pay | Admitting: Gastroenterology

## 2011-10-11 NOTE — Telephone Encounter (Signed)
Forward 11 pages from [facility] to Dr. [name] for review on 10-11-11 ym

## 2011-11-17 ENCOUNTER — Telehealth: Payer: Self-pay | Admitting: Gastroenterology

## 2011-11-17 NOTE — Telephone Encounter (Signed)
Pt of Dr Stark  °

## 2011-11-22 ENCOUNTER — Telehealth: Payer: Self-pay | Admitting: Gastroenterology

## 2011-11-22 ENCOUNTER — Ambulatory Visit: Payer: 59 | Admitting: Gastroenterology

## 2011-11-22 ENCOUNTER — Ambulatory Visit (INDEPENDENT_AMBULATORY_CARE_PROVIDER_SITE_OTHER): Payer: 59 | Admitting: Gastroenterology

## 2011-11-22 ENCOUNTER — Encounter: Payer: Self-pay | Admitting: Gastroenterology

## 2011-11-22 VITALS — BP 86/60 | HR 76 | Ht 63.75 in | Wt 163.5 lb

## 2011-11-22 DIAGNOSIS — R1013 Epigastric pain: Secondary | ICD-10-CM | POA: Insufficient documentation

## 2011-11-22 DIAGNOSIS — K3189 Other diseases of stomach and duodenum: Secondary | ICD-10-CM

## 2011-11-22 DIAGNOSIS — K219 Gastro-esophageal reflux disease without esophagitis: Secondary | ICD-10-CM | POA: Insufficient documentation

## 2011-11-22 DIAGNOSIS — K602 Anal fissure, unspecified: Secondary | ICD-10-CM | POA: Insufficient documentation

## 2011-11-22 DIAGNOSIS — D649 Anemia, unspecified: Secondary | ICD-10-CM

## 2011-11-22 NOTE — Progress Notes (Signed)
History of Present Illness:  This is a 41-year-old Afro-American female referred at the request of Hung for evaluation of an anal fissure. She has been complaining of rectal pain with or without defecation for several months. She's had small amounts of blood on the stool as well. She's been treated with various topicals and suppositories without relief.  She also complains of pyrosis with occasional belching, and abdominal pain. Stools are quite irregular. She is status post gastric bypass surgery in August, 2012. Upper endoscopy and colonoscopy in 2005 were pertinent for esophagitis only.  Hemoglobin in July, 2013 was 10.9.    Past Medical History  Diagnosis Date  . Hypertension   . Anal fissure   . History of gallstones   . Obesity    Past Surgical History  Procedure Date  . Gastric bypass   . Tubal ligation   . Cholecystectomy   . Breast reduction surgery   . Tonsillectomy   . Carpal tunnel release     bilateral  . Ear cyst excision     left  . Nasal septum surgery    family history includes Breast cancer in her paternal aunt; Colon polyps in her maternal grandfather; Diabetes in her father; Heart disease in her father; and Hypertension in her father and mother. Current Outpatient Prescriptions  Medication Sig Dispense Refill  . Calcium Carbonate Antacid (TUMS ULTRA 1000 PO) Take 2 tablets by mouth as needed.      . cholecalciferol (VITAMIN D) 1000 UNITS tablet Take 1,000 Units by mouth daily.      . docusate sodium (COLACE) 100 MG capsule Take 100 mg by mouth 2 (two) times daily.      . Lidocaine, Anorectal, (RECTICARE) 5 % CREA Apply topically as needed.      . lisinopril-hydrochlorothiazide (PRINZIDE,ZESTORETIC) 20-25 MG per tablet Take 1 tablet by mouth daily.      . Multiple Vitamin (MULTIVITAMIN) tablet Take 1 tablet by mouth daily.      . omeprazole (PRILOSEC) 40 MG capsule Take 40 mg by mouth daily.       Allergies as of 11/22/2011  . (No Known Allergies)    reports  that she has never smoked. She has never used smokeless tobacco. She reports that she does not drink alcohol or use illicit drugs.     Review of Systems: Pertinent positive and negative review of systems were noted in the above HPI section. All other review of systems were otherwise negative.  Vital signs were reviewed in today's medical record Physical Exam: General: Well developed , well nourished, no acute distress Head: Normocephalic and atraumatic Eyes:  sclerae anicteric, EOMI Ears: Normal auditory acuity Mouth: No deformity or lesions Neck: Supple, no masses or thyromegaly Lungs: Clear throughout to auscultation Heart: Regular rate and rhythm; no murmurs, rubs or bruits Abdomen: Soft, non tender and non distended. No masses, hepatosplenomegaly or hernias noted. Normal Bowel sounds Rectal: She is tender in the left lateral wall at approximately 9:00 Musculoskeletal: Symmetrical with no gross deformities  Skin: No lesions on visible extremities Pulses:  Normal pulses noted Extremities: No clubbing, cyanosis, edema or deformities noted Neurological: Alert oriented x 4, grossly nonfocal Cervical Nodes:  No significant cervical adenopathy Inguinal Nodes: No significant inguinal adenopathy Psychological:  Alert and cooperative. Normal mood and affect  Anoscopic exam was performed and demonstrated a fissure at approximately 9:00       

## 2011-11-22 NOTE — Assessment & Plan Note (Addendum)
Patient continues to be symptomatic from an anal fissure despite topical therapy and suppositories  Recommendations #1 sigmoidoscopy and Botox injection of the anal fissure

## 2011-11-22 NOTE — Assessment & Plan Note (Signed)
She will followup with her other GI complaints including dyspepsia and anemia with Dr. Elnoria Howard

## 2011-11-22 NOTE — Telephone Encounter (Signed)
Left message for pt to call back  °

## 2011-11-22 NOTE — Patient Instructions (Addendum)
You have been scheduled for your Flexible Sigmoidoscopy Separate instructions have been given

## 2011-11-23 NOTE — Telephone Encounter (Signed)
Discussed procedure with pt and all questions were answered.

## 2011-12-01 ENCOUNTER — Ambulatory Visit (HOSPITAL_COMMUNITY): Admit: 2011-12-01 | Payer: Self-pay | Admitting: Gastroenterology

## 2011-12-01 ENCOUNTER — Encounter (HOSPITAL_COMMUNITY): Payer: Self-pay | Admitting: *Deleted

## 2011-12-01 ENCOUNTER — Ambulatory Visit (HOSPITAL_COMMUNITY)
Admission: RE | Admit: 2011-12-01 | Discharge: 2011-12-01 | Disposition: A | Discharge status: Home / Self Care | Payer: 59 | Source: Ambulatory Visit | Attending: Gastroenterology | Admitting: Gastroenterology

## 2011-12-01 ENCOUNTER — Encounter (HOSPITAL_COMMUNITY)
Admission: RE | Disposition: A | Discharge status: Home / Self Care | Payer: Self-pay | Source: Ambulatory Visit | Attending: Gastroenterology

## 2011-12-01 ENCOUNTER — Encounter (HOSPITAL_COMMUNITY): Payer: Self-pay

## 2011-12-01 DIAGNOSIS — I10 Essential (primary) hypertension: Secondary | ICD-10-CM | POA: Insufficient documentation

## 2011-12-01 DIAGNOSIS — K602 Anal fissure, unspecified: Secondary | ICD-10-CM | POA: Insufficient documentation

## 2011-12-01 DIAGNOSIS — Z79899 Other long term (current) drug therapy: Secondary | ICD-10-CM | POA: Insufficient documentation

## 2011-12-01 HISTORY — DX: Other specified postprocedural states: Z98.890

## 2011-12-01 HISTORY — PX: FLEXIBLE SIGMOIDOSCOPY: SHX5431

## 2011-12-01 HISTORY — DX: Nausea with vomiting, unspecified: R11.2

## 2011-12-01 SURGERY — SIGMOIDOSCOPY, FLEXIBLE
Anesthesia: Moderate Sedation

## 2011-12-01 MED ORDER — MIDAZOLAM HCL 10 MG/2ML IJ SOLN
INTRAMUSCULAR | Status: DC | PRN
  Administered 2011-12-01 (×5): 2 mg via INTRAVENOUS

## 2011-12-01 MED ORDER — SODIUM CHLORIDE 0.9 % IV SOLN: INTRAVENOUS | Status: DC

## 2011-12-01 MED ORDER — FENTANYL CITRATE 0.05 MG/ML IJ SOLN
INTRAMUSCULAR | Status: AC
  Filled 2011-12-01: qty 2

## 2011-12-01 MED ORDER — SODIUM CHLORIDE 0.9 % IJ SOLN: 100.0000 [IU] | Freq: Once | INTRAMUSCULAR | Status: DC

## 2011-12-01 MED ORDER — ONABOTULINUMTOXINA 100 UNITS IJ SOLR
100.0000 [IU] | Freq: Once | INTRAMUSCULAR | Status: AC
  Administered 2011-12-01: 10 [IU] via INTRAMUSCULAR
  Filled 2011-12-01: qty 100

## 2011-12-01 MED ORDER — MIDAZOLAM HCL 10 MG/2ML IJ SOLN
INTRAMUSCULAR | Status: AC
  Filled 2011-12-01: qty 2

## 2011-12-01 MED ORDER — FENTANYL CITRATE 0.05 MG/ML IJ SOLN
INTRAMUSCULAR | Status: DC | PRN
  Administered 2011-12-01 (×4): 25 ug via INTRAVENOUS

## 2011-12-01 NOTE — H&P (View-Only) (Signed)
History of Present Illness:  This is a 41 year old Afro-American female referred at the request of Elnoria Howard for evaluation of an anal fissure. She has been complaining of rectal pain with or without defecation for several months. She's had small amounts of blood on the stool as well. She's been treated with various topicals and suppositories without relief.  She also complains of pyrosis with occasional belching, and abdominal pain. Stools are quite irregular. She is status post gastric bypass surgery in August, 2012. Upper endoscopy and colonoscopy in 2005 were pertinent for esophagitis only.  Hemoglobin in July, 2013 was 10.9.    Past Medical History  Diagnosis Date  . Hypertension   . Anal fissure   . History of gallstones   . Obesity    Past Surgical History  Procedure Date  . Gastric bypass   . Tubal ligation   . Cholecystectomy   . Breast reduction surgery   . Tonsillectomy   . Carpal tunnel release     bilateral  . Ear cyst excision     left  . Nasal septum surgery    family history includes Breast cancer in her paternal aunt; Colon polyps in her maternal grandfather; Diabetes in her father; Heart disease in her father; and Hypertension in her father and mother. Current Outpatient Prescriptions  Medication Sig Dispense Refill  . Calcium Carbonate Antacid (TUMS ULTRA 1000 PO) Take 2 tablets by mouth as needed.      . cholecalciferol (VITAMIN D) 1000 UNITS tablet Take 1,000 Units by mouth daily.      Marland Kitchen docusate sodium (COLACE) 100 MG capsule Take 100 mg by mouth 2 (two) times daily.      . Lidocaine, Anorectal, (RECTICARE) 5 % CREA Apply topically as needed.      Marland Kitchen lisinopril-hydrochlorothiazide (PRINZIDE,ZESTORETIC) 20-25 MG per tablet Take 1 tablet by mouth daily.      . Multiple Vitamin (MULTIVITAMIN) tablet Take 1 tablet by mouth daily.      Marland Kitchen omeprazole (PRILOSEC) 40 MG capsule Take 40 mg by mouth daily.       Allergies as of 11/22/2011  . (No Known Allergies)    reports  that she has never smoked. She has never used smokeless tobacco. She reports that she does not drink alcohol or use illicit drugs.     Review of Systems: Pertinent positive and negative review of systems were noted in the above HPI section. All other review of systems were otherwise negative.  Vital signs were reviewed in today's medical record Physical Exam: General: Well developed , well nourished, no acute distress Head: Normocephalic and atraumatic Eyes:  sclerae anicteric, EOMI Ears: Normal auditory acuity Mouth: No deformity or lesions Neck: Supple, no masses or thyromegaly Lungs: Clear throughout to auscultation Heart: Regular rate and rhythm; no murmurs, rubs or bruits Abdomen: Soft, non tender and non distended. No masses, hepatosplenomegaly or hernias noted. Normal Bowel sounds Rectal: She is tender in the left lateral wall at approximately 9:00 Musculoskeletal: Symmetrical with no gross deformities  Skin: No lesions on visible extremities Pulses:  Normal pulses noted Extremities: No clubbing, cyanosis, edema or deformities noted Neurological: Alert oriented x 4, grossly nonfocal Cervical Nodes:  No significant cervical adenopathy Inguinal Nodes: No significant inguinal adenopathy Psychological:  Alert and cooperative. Normal mood and affect  Anoscopic exam was performed and demonstrated a fissure at approximately 9:00

## 2011-12-01 NOTE — Op Note (Signed)
[facility] [address] [place], [address]   FLEXIBLE SIGMOIDOSCOPY PROCEDURE REPORT  PATIENT: Denise, Roth  MR#: [id number] BIRTHDATE: 08-09-1970 , 41  yrs. old GENDER: Female ENDOSCOPIST: Louis Meckel, MD REFERRED BY: Jeani Hawking, M.D. PROCEDURE DATE:  12/01/2011 PROCEDURE:   Sigmoidoscopy, diagnostic  and anoscopy with Botox injection ASA CLASS:   Class I INDICATIONS: MEDICATIONS: These medications were titrated to patient response per physician's verbal order, Versed 10 mg IV, and Fentanyl 100 mcg IV   DESCRIPTION OF PROCEDURE:   After the risks benefits and alternatives of the procedure were thoroughly explained, informed consent was obtained.  revealed no abnormalities of the rectum. The endoscope was introduced through the anus  and advanced to the sigmoid colon , limited by No adverse events experienced.   The quality of the prep was    .  The instrument was then slowly withdrawn as the mucosa was fully examined.       COLON FINDINGS: The colonic mucosa appeared normal. Retroflexed views revealed no abnormalities.    The scope was then withdrawn from the patient and the procedure terminated.  COMPLICATIONS: There were no complications.  Anoscopic exam was performed. Again noted was a fissure at the 9:00 position. 10 units (0.2 cc) of Botox was injected in the proximal and distal ends of the fissure intramuscularly.  ENDOSCOPIC IMPRESSION:anal fissure-status post Botox injection  RECOMMENDATIONS:continue current medications Office visit one month   REPEAT EXAM:   _______________________________ eSignedLouis Meckel, MD 12/01/2011 1:26 PM   CC:

## 2011-12-01 NOTE — Interval H&P Note (Signed)
History and Physical Interval Note:  12/01/2011 12:41 PM  Denise Roth  has presented today for surgery, with the diagnosis of fissure  The various methods of treatment have been discussed with the patient and family. After consideration of risks, benefits and other options for treatment, the patient has consented to  Procedure(s) (LRB) with comments: FLEXIBLE SIGMOIDOSCOPY (N/A) BOTOX INJECTION (N/A) as a surgical intervention .  The patient's history has been reviewed, patient examined, no change in status, stable for surgery.  I have reviewed the patient's chart and labs.  Questions were answered to the patient's satisfaction.    The recent H&P (dated *11/22/11**) was reviewed, the patient was examined and there is no change in the patients condition since that H&P was completed.   [name]  12/01/2011, 12:41 PM    [name]

## 2011-12-01 NOTE — Interval H&P Note (Signed)
History and Physical Interval Note:  12/01/2011 1:02 PM  Denise Roth  has presented today for surgery, with the diagnosis of fissure  The various methods of treatment have been discussed with the patient and family. After consideration of risks, benefits and other options for treatment, the patient has consented to  Procedure(s) (LRB) with comments: FLEXIBLE SIGMOIDOSCOPY (N/A) BOTOX INJECTION (N/A) as a surgical intervention .  The patient's history has been reviewed, patient examined, no change in status, stable for surgery.  I have reviewed the patient's chart and labs.  Questions were answered to the patient's satisfaction.     The recent H&P (dated *11/22/11**) was reviewed, the patient was examined and there is no change in the patients condition since that H&P was completed.   [name]  12/01/2011, 1:02 PM   [name]

## 2011-12-02 ENCOUNTER — Telehealth: Payer: Self-pay | Admitting: Gastroenterology

## 2011-12-02 ENCOUNTER — Encounter (HOSPITAL_COMMUNITY): Payer: Self-pay | Admitting: Gastroenterology

## 2011-12-02 ENCOUNTER — Encounter (HOSPITAL_COMMUNITY): Payer: Self-pay

## 2011-12-02 NOTE — Telephone Encounter (Signed)
Pt called and is having some stinging in the rectal area and wanted to know if that was normal. Discussed with pt that this could happen and she could soak in a warm tub. Pt verbalized understanding.

## 2011-12-14 ENCOUNTER — Telehealth: Payer: Self-pay | Admitting: Gastroenterology

## 2011-12-14 NOTE — Telephone Encounter (Signed)
Pt had botox injection 12/01/11. Pt states she has been having pain and burning in the rectal area since. Pt states that the pain and burning lasts a long time. Pt scheduled to see Willette Cluster NP 12/15/11@9am . Pt aware of appt date and time.

## 2011-12-15 ENCOUNTER — Encounter: Payer: Self-pay | Admitting: Nurse Practitioner

## 2011-12-15 ENCOUNTER — Ambulatory Visit (INDEPENDENT_AMBULATORY_CARE_PROVIDER_SITE_OTHER): Payer: 59 | Admitting: Nurse Practitioner

## 2011-12-15 VITALS — BP 92/58 | HR 76 | Ht 63.75 in | Wt 165.2 lb

## 2011-12-15 DIAGNOSIS — K602 Anal fissure, unspecified: Secondary | ICD-10-CM

## 2011-12-15 MED ORDER — TRAMADOL HCL 50 MG PO TABS: 50.0000 mg | ORAL_TABLET | Freq: Four times a day (QID) | ORAL | Status: DC | PRN

## 2011-12-15 NOTE — Patient Instructions (Signed)
We sent a prescription for Ultram to [facility] [address]. We have done a referral to [facility] for the Anal fissure.  You will be called with an appointment.

## 2011-12-16 ENCOUNTER — Encounter (INDEPENDENT_AMBULATORY_CARE_PROVIDER_SITE_OTHER): Payer: Self-pay | Admitting: Surgery

## 2011-12-16 ENCOUNTER — Ambulatory Visit (INDEPENDENT_AMBULATORY_CARE_PROVIDER_SITE_OTHER): Payer: 59 | Admitting: Surgery

## 2011-12-16 VITALS — BP 130/74 | HR 58 | Temp 98.3°F | Resp 18 | Ht 63.0 in | Wt 163.8 lb

## 2011-12-16 DIAGNOSIS — K602 Anal fissure, unspecified: Secondary | ICD-10-CM

## 2011-12-16 MED ORDER — HYDROCORTISONE ACE-PRAMOXINE 2.5-1 % RE CREA: TOPICAL_CREAM | Freq: Four times a day (QID) | RECTAL | Status: DC

## 2011-12-16 NOTE — Progress Notes (Signed)
Subjective:     Patient ID: Denise Roth, female   DOB: 1970/07/17, 41 y.o.   MRN: [id number]  HPI  Denise Roth  03-Nov-1970 [id number]  Patient Care Team: Marva Panda as PCP - General Louis Meckel, MD as Consulting Physician (Gastroenterology)  This patient is a 41 y.o.female who presents today for surgical evaluation at the request of Lucrezia Europe & Barnet Pall, [facility].   Reason for evaluation: Persistent anal fissure.  Pleasant woman struggling with anal pain for several months.  Workup concerning for fissure.  Recalls having trials of diltiazem cream, nitroglycerin cream, etc.  Underwent flexible sigmoidoscopy.  No other etiology found.  Botox injection done.  Still struggling with pain.  Is on Colace and fiber.  Tends to have one bowel movement a day but often has many small ones thereafter throughout the day.  Sharp pain during defecation and burning irritation thereafter.  Occasional blood but not severe.  Aunt and father with history of hemorrhoid flares in the past but no history of fissures.  No personal nor family history of GI/colon cancer, inflammatory bowel disease, irritable bowel syndrome, allergy such as Celiac Sprue, dietary/dairy problems, colitis, ulcers nor gastritis.  No recent sick contacts/gastroenteritis.  No travel outside the country.  No changes in diet.  Because of the persistent fissure despite the above interventions, she was sent to me to see if more aggressive surgical intervention is needed.    Patient Active Problem List  Diagnosis  . Anal fissure  . Dyspepsia  . Anemia    Past Medical History  Diagnosis Date  . Hypertension   . Anal fissure   . History of gallstones   . Obesity   . PONV (postoperative nausea and vomiting)   . PCOS (polycystic ovarian syndrome)     Past Surgical History  Procedure Date  . Gastric bypass   . Tubal ligation   . Cholecystectomy   . Breast reduction surgery   . Tonsillectomy   . Carpal tunnel  release     bilateral  . Ear cyst excision     left  . Nasal septum surgery   . Flexible sigmoidoscopy 12/01/2011    Procedure: FLEXIBLE SIGMOIDOSCOPY;  Surgeon: Louis Meckel, MD;  Location: [facility];  Service: Endoscopy;  Laterality: N/A;    History   Social History  . Marital Status: Married    Spouse Name: N/A    Number of Children: 2  . Years of Education: N/A   Occupational History  . 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 Smoking status: Never Smoker   . Smokeless tobacco: Never Used  . Alcohol Use: Yes     rare  . Drug Use: No  . Sexually Active: Not on file   Other Topics Concern  . Not on file   Social History Narrative  . No narrative on file    Family History  Problem Relation Age of Onset  . Breast cancer Paternal Aunt   . Colon polyps Maternal Grandfather   . Diabetes Father   . Hypertension Mother   . Hypertension Father   . Heart disease Father     Current Outpatient Prescriptions  Medication Sig Dispense Refill  . Calcium Carbonate Antacid (TUMS ULTRA 1000 PO) Take 2 tablets by mouth as needed.      . cholecalciferol (VITAMIN D) 1000 UNITS tablet Take 1,000 Units by mouth daily.      Marland Kitchen docusate sodium (COLACE)  100 MG capsule Take 100 mg by mouth 2 (two) times daily.      . Lidocaine, Anorectal, (RECTICARE) 5 % CREA Apply topically as needed.      Marland Kitchen lisinopril-hydrochlorothiazide (PRINZIDE,ZESTORETIC) 20-25 MG per tablet Take 1 tablet by mouth daily.      . Multiple Vitamin (MULTIVITAMIN) tablet Take 1 tablet by mouth daily.      . Nitroglycerin (RECTIV) 0.4 % OINT Place 1 application rectally every 12 (twelve) hours.      Marland Kitchen omeprazole (PRILOSEC) 40 MG capsule Take 40 mg by mouth daily.      . traMADol (ULTRAM) 50 MG tablet Take 1 tablet (50 mg total) by mouth every 6 (six) hours as needed for pain.  30 tablet  0     No Known Allergies  BP 130/74  Pulse 58  Temp 98.3 F (36.8 C) (Oral)  Resp 18   Ht 5\' 3"  (1.6 m)  Wt 163 lb 12.8 oz (74.299 kg)  BMI 29.02 kg/m2  LMP 05/19/2011  No results found.   Review of Systems  Constitutional: Negative for fever, chills, diaphoresis, appetite change and fatigue.  HENT: Negative for ear pain, sore throat, trouble swallowing, neck pain and ear discharge.   Eyes: Negative for photophobia, discharge and visual disturbance.  Respiratory: Negative for cough, choking, chest tightness and shortness of breath.   Cardiovascular: Negative for chest pain and palpitations.  Gastrointestinal: Positive for anal bleeding and rectal pain. Negative for nausea, vomiting, abdominal pain, diarrhea and constipation.  Genitourinary: Negative for dysuria, frequency and difficulty urinating.  Musculoskeletal: Negative for myalgias and gait problem.  Skin: Negative for color change, pallor and rash.  Neurological: Negative for dizziness, speech difficulty, weakness and numbness.  Hematological: Negative for adenopathy.  Psychiatric/Behavioral: Negative for confusion and agitation. The patient is not nervous/anxious.        Objective:   Physical Exam  Constitutional: She is oriented to person, place, and time. She appears well-developed and well-nourished. No distress.  HENT:  Head: Normocephalic.  Mouth/Throat: Oropharynx is clear and moist. No oropharyngeal exudate.  Eyes: Conjunctivae normal and EOM are normal. Pupils are equal, round, and reactive to light. No scleral icterus.  Neck: Normal range of motion. Neck supple. No tracheal deviation present.  Cardiovascular: Normal rate, regular rhythm and intact distal pulses.   Pulmonary/Chest: Effort normal and breath sounds normal. No respiratory distress. She exhibits no tenderness.  Abdominal: Soft. She exhibits no distension and no mass. There is no tenderness. Hernia confirmed negative in the right inguinal area and confirmed negative in the left inguinal area.  Genitourinary: No vaginal discharge found.        Exam done with assistance of female Medical Assistant in the room.  Perianal skin clean with good hygiene.  No pruritis.  Few small external skin tags but none of significance.  No pilonidal disease.  No abscess/fistula.    Increased sphincter tone.  + posterior midline fissure TTP firm  Musculoskeletal: Normal range of motion. She exhibits no tenderness.  Lymphadenopathy:    She has no cervical adenopathy.       Right: No inguinal adenopathy present.       Left: No inguinal adenopathy present.  Neurological: She is alert and oriented to person, place, and time. No cranial nerve deficit. She exhibits normal muscle tone. Coordination normal.  Skin: Skin is warm and dry. No rash noted. She is not diaphoretic. No erythema.  Psychiatric: She has a normal mood and affect. Her behavior is  normal. Judgment and thought content normal.       Assessment:     Chronic posterior midline anal fissure.  Persistent despite numerous topical treatments.    Plan:     Because she is very tried nitroglycerin and diltiazem cream and has had persistent symptoms, I recommended surgical intervention.  Examination under anesthesia and partial inner sphincterotomy.  I discussed the procedure with her:  The anatomy & physiology of the anorectal region was discussed.  The pathophysiology of anal fissure and differential diagnosis was discussed.  Natural history progression  was discussed.   I stressed the importance of a bowel regimen to have daily soft bowel movements to minimize progression of disease.     The patient's condition is not adequately controlled.  Non-operative treatment has not healed the fissure.  Therefore, I recommended examination under anesthesia for better examination to confirm the diagnosis and treat by lateral internal sphincterotomy to relax the spasm better & allow the fissure to heal.  Technique, benefits, alternatives were discussed.   I noted a good likelihood this will help address the  problem.  Risks such as bleeding, pain, incontinence, recurrence, heart attack, death, and other risks were discussed.    Educational handouts further explaining the pathology, treatment options, and bowel regimen were given as well.  The patient expressed understanding & wishes to proceed with surgery.

## 2011-12-16 NOTE — Progress Notes (Signed)
12/16/2011 Denise Roth [id number] Nov 09, 1970   History of Present Illness:   Patient is a 41 year old female was seen by Dr. Arlyce Dice for an anal fissure. Patient has been using Rectiv on a daily basis for 3 months. On 12/01/11 she underwent a sigmoidoscopy with Botox injection of anal fissure. Unfortunately she has not had any relief. Though no recurrent bleeding patient is still having significant rectal pain. She is using Tylenol for pain control. Bowel movements are okay, no constipation.  Current Medications, Allergies, Past Medical History, Past Surgical History, Family History and Social History were reviewed in [employer] record.   Physical Exam: General: Well developed , black female in no acute distress Abdomen: Soft, non tender and non distended. No masses, no hepatomegaly. Normal bowel sounds Rectal: In left decubitus position there is erythema and mild edema at 9:00 position.. Very tender, I did not do extensive exam  Extremities: No edema  Neurological: Alert oriented x 4, grossly nonfocal Psychological:  Alert and cooperative. Normal mood and affect  Assessment and Recommendations: Chronic anal fissure. Patient has been treated with nitroglycerin ointment over the last few months, she had Botox injection of the anal fissure in late September. No further bleeding but she continues to have significant pain from the fissure. Will refer her for surgical evaluation.

## 2011-12-16 NOTE — Patient Instructions (Addendum)
See the Handout(s) we gave you.  Consider surgery.  Please call our office at [phone] if you wish to schedule surgery or if you have further questions / concerns.    Anal Fissure, Adult An anal fissure is a small tear or crack in the skin around the anus. Bleeding from a fissure usually stops on its own within a few minutes. However, bleeding will often reoccur with each bowel movement until the crack heals.  CAUSES   Passing large, hard stools.  Frequent diarrheal stools.  Constipation.  Inflammatory bowel disease (Crohn's disease or ulcerative colitis).  Infections.  Anal sex. SYMPTOMS   Small amounts of blood seen on your stools, on toilet paper, or in the toilet after a bowel movement.  Rectal bleeding.  Painful bowel movements.  Itching or irritation around the anus. DIAGNOSIS Your caregiver will examine the anal area. An anal fissure can usually be seen with careful inspection. A rectal exam may be performed and a short tube (anoscope) may be used to examine the anal canal. TREATMENT   You may be instructed to take fiber supplements. These supplements can soften your stool to help make bowel movements easier.  Sitz baths may be recommended to help heal the tear. Do not use soap in the sitz baths.  A medicated cream or ointment may be prescribed to lessen discomfort. HOME CARE INSTRUCTIONS   Maintain a diet [place] in fruits, whole grains, and vegetables. Avoid constipating foods like bananas and dairy products.  Take sitz baths as directed by your caregiver.  Drink enough fluids to keep your urine clear or pale yellow.  Only take over-the-counter or prescription medicines for pain, discomfort, or fever as directed by your caregiver. Do not take aspirin as this may increase bleeding.  Do not use ointments containing numbing medications (anesthetics) or hydrocortisone. They could slow healing. SEEK MEDICAL CARE IF:   Your fissure is not completely healed  within 3 days.  You have further bleeding.  You have a fever.  You have diarrhea mixed with blood.  You have pain.  Your problem is getting worse rather than better. MAKE SURE YOU:   Understand these instructions.  Will watch your condition.  Will get help right away if you are not doing well or get worse. Document Released: 02/22/2005 Document Revised: 05/17/2011 Document Reviewed: 08/09/2010 [facility] Patient Information 2013 [place], [place].  GETTING TO GOOD BOWEL HEALTH. Irregular bowel habits such as constipation and diarrhea can lead to many problems over time.  Having one soft bowel movement a day is the most important way to prevent further problems.  The anorectal canal is designed to handle stretching and feces to safely manage our ability to get rid of solid waste (feces, poop, stool) out of our body.  BUT, hard constipated stools can act like ripping concrete bricks and diarrhea can be a burning fire to this very sensitive area of our body, causing inflamed hemorrhoids, anal fissures, increasing risk is perirectal abscesses, abdominal pain/bloating, an making irritable bowel worse.     The goal: ONE SOFT BOWEL MOVEMENT A DAY!  To have soft, regular bowel movements:    Drink at least 8 tall glasses of water a day.     Take plenty of fiber.  Fiber is the undigested part of plant food that passes into the colon, acting s "natures broom" to encourage bowel motility and movement.  Fiber can absorb and hold large amounts of water. This results in a larger, bulkier stool, which is  soft and easier to pass. Work gradually over several weeks up to 6 servings a day of fiber (25g a day even more if needed) in the form of: o Vegetables -- Root (potatoes, carrots, turnips), leafy green (lettuce, salad greens, celery, spinach), or cooked Depaola residue (cabbage, broccoli, etc) o Fruit -- Fresh (unpeeled skin & pulp), Dried (prunes, apricots, cherries, etc ),  or stewed ( applesauce)  o Whole  grain breads, pasta, etc (whole wheat)  o Bran cereals    Bulking Agents -- This type of water-retaining fiber generally is easily obtained each day by one of the following:  o Psyllium bran -- The psyllium plant is remarkable because its ground seeds can retain so much water. This product is available as Metamucil, Konsyl, Effersyllium, Per Diem Fiber, or the less expensive generic preparation in drug and health food stores. Although labeled a laxative, it really is not a laxative.  o Methylcellulose -- This is another fiber derived from wood which also retains water. It is available as Citrucel. o Polyethylene Glycol - and "artificial" fiber commonly called Miralax or Glycolax.  It is helpful for people with gassy or bloated feelings with regular fiber o Flax Seed - a less gassy fiber than psyllium   No reading or other relaxing activity while on the toilet. If bowel movements take longer than 5 minutes, you are too constipated   AVOID CONSTIPATION.  Strain fiber and water intake usually takes care of this.  Sometimes a laxative is needed to stimulate more frequent bowel movements, but    Laxatives are not a good long-term solution as it can wear the colon out. o Osmotics (Milk of Magnesia, Fleets phosphosoda, Magnesium citrate, MiraLax, GoLytely) are safer than  o Stimulants (Senokot, Castor Oil, Dulcolax, Ex Lax)    o Do not take laxatives for more than 7days in a row.    IF SEVERELY CONSTIPATED, try a Bowel Retraining Program: o Do not use laxatives.  o Eat a diet [place] in roughage, such as bran cereals and leafy vegetables.  o Drink six (6) ounces of prune or apricot juice each morning.  o Eat two (2) large servings of stewed fruit each day.  o Take one (1) heaping tablespoon of a psyllium-based bulking agent twice a day. Use sugar-free sweetener when possible to avoid excessive calories.  o Eat a normal breakfast.  o Set aside 15 minutes after breakfast to sit on the toilet, but do not strain  to have a bowel movement.  o If you do not have a bowel movement by the third day, use an enema and repeat the above steps.    Controlling diarrhea o Switch to liquids and simpler foods for a few days to avoid stressing your intestines further. o Avoid dairy products (especially milk & ice cream) for a short time.  The intestines often can lose the ability to digest lactose when stressed. o Avoid foods that cause gassiness or bloating.  Typical foods include beans and other legumes, cabbage, broccoli, and dairy foods.  Every person has some sensitivity to other foods, so listen to our body and avoid those foods that trigger problems for you. o Adding fiber (Citrucel, Metamucil, psyllium, Miralax) gradually can help thicken stools by absorbing excess fluid and retrain the intestines to act more normally.  Slowly increase the dose over a few weeks.  Too much fiber too soon can backfire and cause cramping & bloating. o Probiotics (such as active yogurt, Align, etc) may help repopulate  the intestines and colon with normal bacteria and calm down a sensitive digestive tract.  Most studies show it to be of mild help, though, and such products can be costly. o Medicines:   Bismuth subsalicylate (ex. Kayopectate, Pepto Bismol) every 30 minutes for up to 6 doses can help control diarrhea.  Avoid if pregnant.   Loperamide (Immodium) can slow down diarrhea.  Start with two tablets (3m total) first and then try one tablet every 6 hours.  Avoid if you are having fevers or severe pain.  If you are not better or start feeling worse, stop all medicines and call your doctor for advice o Call your doctor if you are getting worse or not better.  Sometimes further testing (cultures, endoscopy, X-ray studies, bloodwork, etc) may be needed to help diagnose and treat the cause of the diarrhea. o

## 2011-12-16 NOTE — Progress Notes (Signed)
Reviewed and agree with management. Virdell Hoiland D. Leilanny Fluitt, M.D., FACG  

## 2011-12-21 MED FILL — OnabotulinumtoxinA For Inj 100 Unit: INTRAMUSCULAR | Qty: 100 | Status: AC

## 2012-01-04 ENCOUNTER — Ambulatory Visit: Payer: 59 | Admitting: Gastroenterology

## 2012-01-04 DIAGNOSIS — K602 Anal fissure, unspecified: Secondary | ICD-10-CM

## 2012-01-04 HISTORY — PX: ANAL EXAMINATION UNDER ANESTHESIA: SHX1138

## 2012-01-19 ENCOUNTER — Encounter (INDEPENDENT_AMBULATORY_CARE_PROVIDER_SITE_OTHER): Payer: Self-pay | Admitting: Surgery

## 2012-01-19 ENCOUNTER — Ambulatory Visit (INDEPENDENT_AMBULATORY_CARE_PROVIDER_SITE_OTHER): Payer: 59 | Admitting: Surgery

## 2012-01-19 VITALS — BP 110/64 | HR 76 | Temp 97.3°F | Resp 20 | Ht 63.0 in | Wt 161.4 lb

## 2012-01-19 DIAGNOSIS — K612 Anorectal abscess: Secondary | ICD-10-CM

## 2012-01-19 DIAGNOSIS — K610 Anal abscess: Secondary | ICD-10-CM

## 2012-01-19 DIAGNOSIS — K602 Anal fissure, unspecified: Secondary | ICD-10-CM

## 2012-01-19 MED ORDER — FLUCONAZOLE 150 MG PO TABS: 150.0000 mg | ORAL_TABLET | Freq: Once | ORAL | Status: AC

## 2012-01-19 MED ORDER — AMOXICILLIN-POT CLAVULANATE 875-125 MG PO TABS: 1.0000 | ORAL_TABLET | Freq: Two times a day (BID) | ORAL | Status: DC

## 2012-01-19 NOTE — Progress Notes (Signed)
Subjective:     Patient ID: Denise Roth, female   DOB: 11-Jan-1971, 41 y.o.   MRN: [id number]  HPI  [name]  [birth year] [id number]  Patient Care Team: Marva Panda as PCP - General Louis Meckel, MD as Consulting Physician (Gastroenterology)  This patient is a 41 y.o.female who presents today for surgical evaluation   Procedure: Examination under anesthesia.  Lateral internal sphincterotomy.  Removal of anal tags.  01/04/2012.  The patient comes in today feeling better.  Rectal pain going down.  No bleeding and sent for a little bit noticed with wipes.  Initially denied much drainage but did not notice a few drops of discharge.  Having daily bowel movements.  Off narcotics.  Overall improving.  Patient Active Problem List  Diagnosis  . Anal fissure s/p LLsphincterotomy 01/04/2012  . Dyspepsia  . Anemia  . Abscess, perianal, right posterior    Past Medical History  Diagnosis Date  . Hypertension   . Anal fissure   . History of gallstones   . Obesity   . PONV (postoperative nausea and vomiting)   . PCOS (polycystic ovarian syndrome)     Past Surgical History  Procedure Date  . Gastric bypass   . Tubal ligation   . Cholecystectomy   . Breast reduction surgery   . Tonsillectomy   . Carpal tunnel release     bilateral  . Ear cyst excision     left  . Nasal septum surgery   . Flexible sigmoidoscopy 12/01/2011    Procedure: FLEXIBLE SIGMOIDOSCOPY;  Surgeon: Louis Meckel, MD;  Location: [facility];  Service: Endoscopy;  Laterality: N/A;    History   Social History  . Marital Status: Married    Spouse Name: N/A    Number of Children: 2  . Years of Education: N/A   Occupational History  . 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 Smoking status: Never Smoker   . Smokeless tobacco: Never Used  . Alcohol Use: Yes     Comment: rare  . Drug Use: No  . Sexually Active: Not on file   Other Topics Concern  . Not  on file   Social History Narrative  . No narrative on file    Family History  Problem Relation Age of Onset  . Breast cancer Paternal Aunt   . Colon polyps Maternal Grandfather   . Diabetes Father   . Hypertension Mother   . Hypertension Father   . Heart disease Father     Current Outpatient Prescriptions  Medication Sig Dispense Refill  . Calcium Carbonate Antacid (TUMS ULTRA 1000 PO) Take 2 tablets by mouth as needed.      . cholecalciferol (VITAMIN D) 1000 UNITS tablet Take 1,000 Units by mouth daily.      Marland Kitchen docusate sodium (COLACE) 100 MG capsule Take 100 mg by mouth 2 (two) times daily.      Marland Kitchen EPIDUO 0.1-2.5 % gel       . hydrocortisone-pramoxine (ANALPRAM-HC) 2.5-1 % rectal cream Place rectally 4 (four) times daily. For irritated and painful hemorrhoids  15 g  2  . Lidocaine, Anorectal, (RECTICARE) 5 % CREA Apply topically as needed.      Marland Kitchen lisinopril-hydrochlorothiazide (PRINZIDE,ZESTORETIC) 20-25 MG per tablet Take 1 tablet by mouth daily.      . Multiple Vitamin (MULTIVITAMIN) tablet Take 1 tablet by mouth daily.      . Nitroglycerin (RECTIV) 0.4 % OINT Place 1  application rectally every 12 (twelve) hours.      Marland Kitchen omeprazole (PRILOSEC) 40 MG capsule Take 40 mg by mouth daily.      . traMADol (ULTRAM) 50 MG tablet Take 1 tablet (50 mg total) by mouth every 6 (six) hours as needed for pain.  30 tablet  0  . amoxicillin-clavulanate (AUGMENTIN) 875-125 MG per tablet Take 1 tablet by mouth 2 (two) times daily.  14 tablet  3  . fluconazole (DIFLUCAN) 150 MG tablet Take 1 tablet (150 mg total) by mouth once.  1 tablet  3     No Known Allergies  BP 110/64  Pulse 76  Temp 97.3 F (36.3 C) (Temporal)  Resp 20  Ht 5\' 3"  (1.6 m)  Wt 161 lb 6.4 oz (73.211 kg)  BMI 28.59 kg/m2  No results found.   Review of Systems  Constitutional: Negative for fever, chills and diaphoresis.  HENT: Negative for ear pain, sore throat and trouble swallowing.   Eyes: Negative for photophobia  and visual disturbance.  Respiratory: Negative for cough and choking.   Cardiovascular: Negative for chest pain and palpitations.  Gastrointestinal: Negative for nausea, vomiting, abdominal pain, diarrhea, constipation, blood in stool, abdominal distention and rectal pain.  Genitourinary: Negative for dysuria, frequency and difficulty urinating.  Musculoskeletal: Negative for myalgias and gait problem.  Skin: Negative for color change, pallor and rash.  Neurological: Negative for dizziness, speech difficulty, weakness and numbness.  Hematological: Negative for adenopathy.  Psychiatric/Behavioral: Negative for confusion and agitation. The patient is not nervous/anxious.        Objective:   Physical Exam  Constitutional: She is oriented to person, place, and time. She appears well-developed and well-nourished. No distress.  HENT:  Head: Normocephalic.  Mouth/Throat: Oropharynx is clear and moist. No oropharyngeal exudate.  Eyes: Conjunctivae normal and EOM are normal. Pupils are equal, round, and reactive to light. No scleral icterus.  Neck: Normal range of motion. No tracheal deviation present.  Cardiovascular: Normal rate and intact distal pulses.   Pulmonary/Chest: Effort normal. No respiratory distress. She exhibits no tenderness.  Abdominal: Soft. She exhibits no distension. There is no tenderness. Hernia confirmed negative in the right inguinal area and confirmed negative in the left inguinal area.       Incisions clean with normal healing ridges.  No hernias  Genitourinary: No vaginal discharge found.       Exam done with assistance of female Medical Assistant in the room.  Perianal skin clean with good hygiene.  No pruritis.  No external skin tags / hemorrhoids of significance.  No pilonidal disease.   Normal sphincter tone.    Anal fissure wound granulating and soft.  Small 7mm superficial abscess left posterior spontaneously draining.  Musculoskeletal: Normal range of motion. She  exhibits no tenderness.  Lymphadenopathy:       Right: No inguinal adenopathy present.       Left: No inguinal adenopathy present.  Neurological: She is alert and oriented to person, place, and time. No cranial nerve deficit. She exhibits normal muscle tone. Coordination normal.  Skin: Skin is warm and dry. No rash noted. She is not diaphoretic.  Psychiatric: She has a normal mood and affect. Her behavior is normal.       Assessment:     Anal fissure healing status post sphincterotomy.  Mild perianal abscess at closure site.  Draining.    Plan:     Increase activity as tolerated to regular activity.  Do not push through pain.  Diet as tolerated. Bowel regimen to avoid problems.  Return to clinic 2 weeks to follow abscess, sooner if worse.  Warm soaks.  Hold off on surgical drainage since small & draining.  Augmentin x7d w fluconazole x1.    Instructions discussed.  Followup with primary care physician for other health issues as would normally be done.  Questions answered.  The patient expressed understanding and appreciation

## 2012-01-19 NOTE — Patient Instructions (Addendum)
You have a small abscess incision where the sphincterotomy was done.  Take antibiotics and do warm soaks to help it clear up  ANORECTAL SURGERY: POST OP INSTRUCTIONS  1. Take your usually prescribed home medications unless otherwise directed. 2. DIET: Follow a light bland diet the first 24 hours after arrival home, such as soup, liquids, crackers, etc.  Be sure to include lots of fluids daily.  Avoid fast food or heavy meals as your are more likely to get nauseated.  Eat a low fat the next few days after surgery.   3. PAIN CONTROL: a. Pain is best controlled by a usual combination of three different methods TOGETHER: i. Ice/Heat ii. Over the counter pain medication iii. Prescription pain medication b. Most patients will experience some swelling and discomfort in the anus/rectal area. and incisions.  Ice packs or heat (30-60 minutes up to 6 times a day) will help. Use ice for the first few days to help decrease swelling and bruising, then switch to heat such as warm towels, sitz baths, warm baths, etc to help relax tight/sore spots and speed recovery.  Some people prefer to use ice alone, heat alone, alternating between ice & heat.  Experiment to what works for you.  Swelling and bruising can take several weeks to resolve.   c. It is helpful to take an over-the-counter pain medication regularly for the first few weeks.  Choose one of the following that works best for you: i. Naproxen (Aleve, etc)  Two 220mg  tabs twice a day ii. Ibuprofen (Advil, etc) Three 200mg  tabs four times a day (every meal & bedtime) iii. Acetaminophen (Tylenol, etc) 500-650mg  four times a day (every meal & bedtime) d. A  prescription for pain medication (such as oxycodone, hydrocodone, etc) should be given to you upon discharge.  Take your pain medication as prescribed.  i. If you are having problems/concerns with the prescription medicine (does not control pain, nausea, vomiting, rash, itching, etc), please call us [phone]  [phone] to see if we need to switch you to a different pain medicine that will work better for you and/or control your side effect better. ii. If you need a refill on your pain medication, please contact your pharmacy.  They will contact our office to request authorization. Prescriptions will not be filled after 5 pm or on week-ends. 4. KEEP YOUR BOWELS REGULAR a. The goal is one bowel movement a day b. Avoid getting constipated.  Between the surgery and the pain medications, it is common to experience some constipation.  Increasing fluid intake and taking a fiber supplement (such as Metamucil, Citrucel, FiberCon, MiraLax, etc) 1-2 times a day regularly will usually help prevent this problem from occurring.  A mild laxative (prune juice, Milk of Magnesia, MiraLax, etc) should be taken according to package directions if there are no bowel movements after 48 hours. c. Watch out for diarrhea.  If you have many loose bowel movements, simplify your diet to bland foods & liquids for a few days.  Stop any stool softeners and decrease your fiber supplement.  Switching to mild anti-diarrheal medications (Kayopectate, Pepto Bismol) can help.  If this worsens or does not improve, please call us.  5. Wound Care a. Remove your bandages the day after surgery.  Unless discharge instructions indicate otherwise, leave your bandage dry and in place overnight.  Remove the bandage during your first bowel movement.   b. Allow the wound packing to fall out over the next few days.  You can  trim exposed gauze / ribbon as it falls out.  You do not need to repack the wound unless instructed otherwise.  Wear an absorbent pad or soft cotton gauze in your underwear as needed to catch any drainage and help keep the area  c. Keep the area clean and dry.  Bathe / shower every day.  Keep the area clean by showering / bathing over the incision / wound.   It is okay to soak an open wound to help wash it.  Wet wipes or showers / gentle  washing after bowel movements is often less traumatic than regular toilet paper. d. Bonita Quin may have some styrofoam-like soft packing in the rectum which will come out with the first bowel movement.  e. You will often notice bleeding with bowel movements.  This should slow down by the end of the first week of surgery f. Expect some drainage.  This should slow down, too, by the end of the first week of surgery.  Wear an absorbent pad or soft cotton gauze in your underwear until the drainage stops. 6. ACTIVITIES as tolerated:   a. You may resume regular (light) daily activities beginning the next day-such as daily self-care, walking, climbing stairs-gradually increasing activities as tolerated.  If you can walk 30 minutes without difficulty, it is safe to try more intense activity such as jogging, treadmill, bicycling, low-impact aerobics, swimming, etc. b. Save the most intensive and strenuous activity for last such as sit-ups, heavy lifting, contact sports, etc  Refrain from any heavy lifting or straining until you are off narcotics for pain control.   c. DO NOT PUSH THROUGH PAIN.  Let pain be your guide: If it hurts to do something, don't do it.  Pain is your body warning you to avoid that activity for another week until the pain goes down. d. You may drive when you are no longer taking prescription pain medication, you can comfortably sit for long periods of time, and you can safely maneuver your car and apply brakes. e. Bonita Quin may have sexual intercourse when it is comfortable.  7. FOLLOW UP in our office a. Please call [facility] at [phone] to set up an appointment to see your surgeon in the office for a follow-up appointment approximately 2 weeks after your surgery. b. Make sure that you call for this appointment the day you arrive home to insure a convenient appointment time. 10. IF YOU HAVE DISABILITY OR FAMILY LEAVE FORMS, BRING THEM TO THE OFFICE FOR PROCESSING.  DO NOT GIVE THEM TO YOUR  DOCTOR.        WHEN TO CALL us [phone]: 1. Poor pain control 2. Reactions / problems with new medications (rash/itching, nausea, etc)  3. Fever over 101.5 F (38.5 C) 4. Inability to urinate 5. Nausea and/or vomiting 6. Worsening swelling or bruising 7. Continued bleeding from incision. 8. Increased pain, redness, or drainage from the incision  The clinic staff is available to answer your questions during regular business hours (8:30am-5pm).  Please don't hesitate to call and ask to speak to one of our nurses for clinical concerns.   A surgeon from [facility] is always on call at the hospitals   If you have a medical emergency, go to the nearest emergency room or call 911.    [facility] [address], [address], [place], [place]  [address] ? MAIN: [phone] ? TOLL FREE: [phone] ? FAX [phone] [email or web address]     Peri-Rectal  Abscess Your caregiver has diagnosed you as having a peri-rectal abscess. This is an infected area near the rectum that is filled with pus. If the abscess is near the surface of the skin, your caregiver may open (incise) the area and drain the pus. HOME CARE INSTRUCTIONS   If your abscess was opened up and drained. A small piece of gauze may be placed in the opening so that it can drain. Do not remove the gauze unless directed by your caregiver.  A loose dressing may be placed over the abscess site. Change the dressing as often as necessary to keep it clean and dry.  After the drain is removed, the area may be washed with a gentle antiseptic (soap) four times per day.  A warm sitz bath, warm packs or heating pad may be used for pain relief, taking care not to burn yourself.  Return for a wound check in 1 day or as directed.  An "inflatable doughnut" may be used for sitting with added comfort. These can be purchased at a drugstore or medical supply house.  To reduce pain and  straining with bowel movements, eat a Esty fiber diet with plenty of fruits and vegetables. Use stool softeners as recommended by your caregiver. This is especially important if narcotic type pain medications were prescribed as these may cause marked constipation.  Only take over-the-counter or prescription medicines for pain, discomfort, or fever as directed by your caregiver. SEEK IMMEDIATE MEDICAL CARE IF:   You have increasing pain that is not controlled by medication.  There is increased inflammation (redness), swelling, bleeding, or drainage from the area.  An oral temperature above 102 F (38.9 C) develops.  You develop chills or generalized malaise (feel lethargic or feel "washed out").  You develop any new symptoms (problems) you feel may be related to your present problem. Document Released: 02/20/2000 Document Revised: 05/17/2011 Document Reviewed: 02/20/2008 [facility] Patient Information 2013 [place], [place].

## 2012-02-01 ENCOUNTER — Encounter (INDEPENDENT_AMBULATORY_CARE_PROVIDER_SITE_OTHER): Payer: 59 | Admitting: Surgery

## 2012-02-09 ENCOUNTER — Encounter (INDEPENDENT_AMBULATORY_CARE_PROVIDER_SITE_OTHER): Payer: Self-pay | Admitting: Surgery

## 2012-02-09 ENCOUNTER — Ambulatory Visit (INDEPENDENT_AMBULATORY_CARE_PROVIDER_SITE_OTHER): Payer: 59 | Admitting: Surgery

## 2012-02-09 VITALS — BP 92/60 | HR 56 | Temp 98.7°F | Resp 16 | Ht 63.0 in | Wt 159.0 lb

## 2012-02-09 DIAGNOSIS — K610 Anal abscess: Secondary | ICD-10-CM

## 2012-02-09 DIAGNOSIS — K602 Anal fissure, unspecified: Secondary | ICD-10-CM

## 2012-02-09 DIAGNOSIS — K612 Anorectal abscess: Secondary | ICD-10-CM

## 2012-02-09 NOTE — Progress Notes (Signed)
Subjective:     Patient ID: Denise Roth, female   DOB: 1970/10/02, 41 y.o.   MRN: [id number]  HPI   Denise Roth  1970-03-16 [id number]  Patient Care Team: Marva Panda as PCP - General Louis Meckel, MD as Consulting Physician (Gastroenterology)  This patient is a 41 y.o.female who presents today for surgical evaluation   Procedure: Examination under anesthesia.  Lateral internal sphincterotomy.  Removal of anal tags.  01/04/2012.  The patient comes in today feeling better.  Rectal pain gone.  No bleeding nor any discharge.  Having daily bowel movements.  Off narcotics.  Overall improving.  Patient Active Problem List  Diagnosis  . Anal fissure s/p LLsphincterotomy 01/04/2012  . Dyspepsia  . Anemia    Past Medical History  Diagnosis Date  . Hypertension   . Anal fissure   . History of gallstones   . Obesity   . PONV (postoperative nausea and vomiting)   . PCOS (polycystic ovarian syndrome)   . Perianal abscess     Past Surgical History  Procedure Date  . Gastric bypass   . Tubal ligation   . Cholecystectomy   . Breast reduction surgery   . Tonsillectomy   . Carpal tunnel release     bilateral  . Ear cyst excision     left  . Nasal septum surgery   . Flexible sigmoidoscopy 12/01/2011    Procedure: FLEXIBLE SIGMOIDOSCOPY;  Surgeon: Louis Meckel, MD;  Location: [facility];  Service: Endoscopy;  Laterality: N/A;  . Anal examination under anesthesia 01/04/12    sphincterotomy by Dr. Michaell Cowing    History   Social History  . Marital Status: Married    Spouse Name: N/A    Number of Children: 2  . Years of Education: N/A   Occupational History  . 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 Smoking status: Never Smoker   . Smokeless tobacco: Never Used  . Alcohol Use: Yes     Comment: rare  . Drug Use: No  . Sexually Active: Not on file   Other Topics Concern  . Not on file   Social History Narrative  . No  narrative on file    Family History  Problem Relation Age of Onset  . Breast cancer Paternal Aunt   . Colon polyps Maternal Grandfather   . Diabetes Father   . Hypertension Mother   . Hypertension Father   . Heart disease Father     Current Outpatient Prescriptions  Medication Sig Dispense Refill  . Calcium Carbonate Antacid (TUMS ULTRA 1000 PO) Take 2 tablets by mouth as needed.      . cholecalciferol (VITAMIN D) 1000 UNITS tablet Take 1,000 Units by mouth daily.      Marland Kitchen docusate sodium (COLACE) 100 MG capsule Take 100 mg by mouth 2 (two) times daily.      Marland Kitchen EPIDUO 0.1-2.5 % gel       . lisinopril-hydrochlorothiazide (PRINZIDE,ZESTORETIC) 20-25 MG per tablet Take 1 tablet by mouth daily.      . Multiple Vitamin (MULTIVITAMIN) tablet Take 1 tablet by mouth daily.      Marland Kitchen omeprazole (PRILOSEC) 40 MG capsule Take 40 mg by mouth daily.      Marland Kitchen amoxicillin-clavulanate (AUGMENTIN) 875-125 MG per tablet Take 1 tablet by mouth 2 (two) times daily.  10 tablet  2  . hydrocortisone-pramoxine (ANALPRAM-HC) 2.5-1 % rectal cream Place rectally 4 (four) times daily. For irritated and  painful hemorrhoids  15 g  2  . Lidocaine, Anorectal, (RECTICARE) 5 % CREA Apply topically as needed.      . Nitroglycerin (RECTIV) 0.4 % OINT Place 1 application rectally every 12 (twelve) hours.      . traMADol (ULTRAM) 50 MG tablet Take 1 tablet (50 mg total) by mouth every 6 (six) hours as needed for pain.  30 tablet  0     No Known Allergies  BP 92/60  Pulse 56  Temp 98.7 F (37.1 C) (Temporal)  Resp 16  Ht 5\' 3"  (1.6 m)  Wt 159 lb (72.122 kg)  BMI 28.17 kg/m2  No results found.   Review of Systems  Constitutional: Negative for fever, chills and diaphoresis.  HENT: Negative for ear pain, sore throat and trouble swallowing.   Eyes: Negative for photophobia and visual disturbance.  Respiratory: Negative for cough and choking.   Cardiovascular: Negative for chest pain and palpitations.   Gastrointestinal: Negative for nausea, vomiting, abdominal pain, diarrhea, constipation, blood in stool, abdominal distention, anal bleeding and rectal pain.  Genitourinary: Negative for dysuria, frequency and difficulty urinating.  Musculoskeletal: Negative for myalgias and gait problem.  Skin: Negative for color change, pallor and rash.  Neurological: Negative for dizziness, speech difficulty, weakness and numbness.  Hematological: Negative for adenopathy.  Psychiatric/Behavioral: Negative for confusion and agitation. The patient is not nervous/anxious.        Objective:   Physical Exam  Constitutional: She is oriented to person, place, and time. She appears well-developed and well-nourished. No distress.  HENT:  Head: Normocephalic.  Mouth/Throat: Oropharynx is clear and moist. No oropharyngeal exudate.  Eyes: Conjunctivae normal and EOM are normal. Pupils are equal, round, and reactive to light. No scleral icterus.  Neck: Normal range of motion. No tracheal deviation present.  Cardiovascular: Normal rate and intact distal pulses.   Pulmonary/Chest: Effort normal. No respiratory distress. She exhibits no tenderness.  Abdominal: Soft. She exhibits no distension. There is no tenderness. Hernia confirmed negative in the right inguinal area and confirmed negative in the left inguinal area.       Incisions clean with normal healing ridges.  No hernias  Genitourinary: There is no rash on the right labia. There is no rash on the left labia. No vaginal discharge found.       Exam done with assistance of female Medical Assistant in the room.  Perianal skin clean with good hygiene.  No pruritis.  No external skin tags / hemorrhoids of significance.  No pilonidal disease.   Normal sphincter tone.    Anal fissure wound granulating and soft.  Nearly closed.  No more abscess - mild scar left posterior.  nontender  Musculoskeletal: Normal range of motion. She exhibits no tenderness.   Lymphadenopathy:       Right: No inguinal adenopathy present.       Left: No inguinal adenopathy present.  Neurological: She is alert and oriented to person, place, and time. No cranial nerve deficit. She exhibits normal muscle tone. Coordination normal.  Skin: Skin is warm and dry. No rash noted. She is not diaphoretic.  Psychiatric: She has a normal mood and affect. Her behavior is normal.       Assessment:     Anal fissure healing status post sphincterotomy.  Mild perianal abscess at closure site - resolved  Plan:     Increase activity as tolerated to regular activity.  Do not push through pain.  Diet as tolerated. Bowel regimen to avoid problems.  Return to clinic PRN.  Followup with primary care physician for other health issues as would normally be done.  Questions answered.  The patient expressed understanding and appreciation

## 2012-02-09 NOTE — Patient Instructions (Signed)
GETTING TO GOOD BOWEL HEALTH. Irregular bowel habits such as constipation and diarrhea can lead to many problems over time.  Having one soft bowel movement a day is the most important way to prevent further problems.  The anorectal canal is designed to handle stretching and feces to safely manage our ability to get rid of solid waste (feces, poop, stool) out of our body.  BUT, hard constipated stools can act like ripping concrete bricks and diarrhea can be a burning fire to this very sensitive area of our body, causing inflamed hemorrhoids, anal fissures, increasing risk is perirectal abscesses, abdominal pain/bloating, an making irritable bowel worse.     The goal: ONE SOFT BOWEL MOVEMENT A DAY!  To have soft, regular bowel movements:    Drink at least 8 tall glasses of water a day.     Take plenty of fiber.  Fiber is the undigested part of plant food that passes into the colon, acting s "natures broom" to encourage bowel motility and movement.  Fiber can absorb and hold large amounts of water. This results in a larger, bulkier stool, which is soft and easier to pass. Work gradually over several weeks up to 6 servings a day of fiber (25g a day even more if needed) in the form of: o Vegetables -- Root (potatoes, carrots, turnips), leafy green (lettuce, salad greens, celery, spinach), or cooked Wittke residue (cabbage, broccoli, etc) o Fruit -- Fresh (unpeeled skin & pulp), Dried (prunes, apricots, cherries, etc ),  or stewed ( applesauce)  o Whole grain breads, pasta, etc (whole wheat)  o Bran cereals    Bulking Agents -- This type of water-retaining fiber generally is easily obtained each day by one of the following:  o Psyllium bran -- The psyllium plant is remarkable because its ground seeds can retain so much water. This product is available as Metamucil, Konsyl, Effersyllium, Per Diem Fiber, or the less expensive generic preparation in drug and health food stores. Although labeled a laxative, it really  is not a laxative.  o Methylcellulose -- This is another fiber derived from wood which also retains water. It is available as Citrucel. o Polyethylene Glycol - and "artificial" fiber commonly called Miralax or Glycolax.  It is helpful for people with gassy or bloated feelings with regular fiber o Flax Seed - a less gassy fiber than psyllium   No reading or other relaxing activity while on the toilet. If bowel movements take longer than 5 minutes, you are too constipated   AVOID CONSTIPATION.  [place] fiber and water intake usually takes care of this.  Sometimes a laxative is needed to stimulate more frequent bowel movements, but    Laxatives are not a good long-term solution as it can wear the colon out. o Osmotics (Milk of Magnesia, Fleets phosphosoda, Magnesium citrate, MiraLax, GoLytely) are safer than  o Stimulants (Senokot, Castor Oil, Dulcolax, Ex Lax)    o Do not take laxatives for more than 7days in a row.    IF SEVERELY CONSTIPATED, try a Bowel Retraining Program: o Do not use laxatives.  o Eat a diet Yoshida in roughage, such as bran cereals and leafy vegetables.  o Drink six (6) ounces of prune or apricot juice each morning.  o Eat two (2) large servings of stewed fruit each day.  o Take one (1) heaping tablespoon of a psyllium-based bulking agent twice a day. Use sugar-free sweetener when possible to avoid excessive calories.  o Eat a normal breakfast.  o   Set aside 15 minutes after breakfast to sit on the toilet, but do not strain to have a bowel movement.  o If you do not have a bowel movement by the third day, use an enema and repeat the above steps.    Controlling diarrhea o Switch to liquids and simpler foods for a few days to avoid stressing your intestines further. o Avoid dairy products (especially milk & ice cream) for a short time.  The intestines often can lose the ability to digest lactose when stressed. o Avoid foods that cause gassiness or bloating.  Typical foods include  beans and other legumes, cabbage, broccoli, and dairy foods.  Every person has some sensitivity to other foods, so listen to our body and avoid those foods that trigger problems for you. o Adding fiber (Citrucel, Metamucil, psyllium, Miralax) gradually can help thicken stools by absorbing excess fluid and retrain the intestines to act more normally.  Slowly increase the dose over a few weeks.  Too much fiber too soon can backfire and cause cramping & bloating. o Probiotics (such as active yogurt, Align, etc) may help repopulate the intestines and colon with normal bacteria and calm down a sensitive digestive tract.  Most studies show it to be of mild help, though, and such products can be costly. o Medicines:   Bismuth subsalicylate (ex. Kayopectate, Pepto Bismol) every 30 minutes for up to 6 doses can help control diarrhea.  Avoid if pregnant.   Loperamide (Immodium) can slow down diarrhea.  Start with two tablets (4mg  total) first and then try one tablet every 6 hours.  Avoid if you are having fevers or severe pain.  If you are not better or start feeling worse, stop all medicines and call your doctor for advice o Call your doctor if you are getting worse or not better.  Sometimes further testing (cultures, endoscopy, X-ray studies, bloodwork, etc) may be needed to help diagnose and treat the cause of the diarrhea.  .ANORECTAL SURGERY: POST OP INSTRUCTIONS  1. Take your usually prescribed home medications unless otherwise directed. 2. DIET: Follow a light bland diet the first 24 hours after arrival home, such as soup, liquids, crackers, etc.  Be sure to include lots of fluids daily.  Avoid fast food or heavy meals as your are more likely to get nauseated.  Eat a low fat the next few days after surgery.   3. PAIN CONTROL: a. Pain is best controlled by a usual combination of three different methods TOGETHER: i. Ice/Heat ii. Over the counter pain medication iii. Prescription pain medication b. Most  patients will experience some swelling and discomfort in the anus/rectal area. and incisions.  Ice packs or heat (30-60 minutes up to 6 times a day) will help. Use ice for the first few days to help decrease swelling and bruising, then switch to heat such as warm towels, sitz baths, warm baths, etc to help relax tight/sore spots and speed recovery.  Some people prefer to use ice alone, heat alone, alternating between ice & heat.  Experiment to what works for you.  Swelling and bruising can take several weeks to resolve.   c. It is helpful to take an over-the-counter pain medication regularly for the first few weeks.  Choose one of the following that works best for you: i. Naproxen (Aleve, etc)  Two 220mg  tabs twice a day ii. Ibuprofen (Advil, etc) Three 200mg  tabs four times a day (every meal & bedtime) iii. Acetaminophen (Tylenol, etc) 500-650mg  four times a  day (every meal & bedtime) d. A  prescription for pain medication (such as oxycodone, hydrocodone, etc) should be given to you upon discharge.  Take your pain medication as prescribed.  i. If you are having problems/concerns with the prescription medicine (does not control pain, nausea, vomiting, rash, itching, etc), please call us [phone] to see if we need to switch you to a different pain medicine that will work better for you and/or control your side effect better. ii. If you need a refill on your pain medication, please contact your pharmacy.  They will contact our office to request authorization. Prescriptions will not be filled after 5 pm or on week-ends. 4. KEEP YOUR BOWELS REGULAR a. The goal is one bowel movement a day b. Avoid getting constipated.  Between the surgery and the pain medications, it is common to experience some constipation.  Increasing fluid intake and taking a fiber supplement (such as Metamucil, Citrucel, FiberCon, MiraLax, etc) 1-2 times a day regularly will usually help prevent this problem from occurring.  A mild  laxative (prune juice, Milk of Magnesia, MiraLax, etc) should be taken according to package directions if there are no bowel movements after 48 hours. c. Watch out for diarrhea.  If you have many loose bowel movements, simplify your diet to bland foods & liquids for a few days.  Stop any stool softeners and decrease your fiber supplement.  Switching to mild anti-diarrheal medications (Kayopectate, Pepto Bismol) can help.  If this worsens or does not improve, please call us.  5. Wound Care a. Remove your bandages the day after surgery.  Unless discharge instructions indicate otherwise, leave your bandage dry and in place overnight.  Remove the bandage during your first bowel movement.   b. Allow the wound packing to fall out over the next few days.  You can trim exposed gauze / ribbon as it falls out.  You do not need to repack the wound unless instructed otherwise.  Wear an absorbent pad or soft cotton gauze in your underwear as needed to catch any drainage and help keep the area  c. Keep the area clean and dry.  Bathe / shower every day.  Keep the area clean by showering / bathing over the incision / wound.   It is okay to soak an open wound to help wash it.  Wet wipes or showers / gentle washing after bowel movements is often less traumatic than regular toilet paper. d. Bonita Quin may have some styrofoam-like soft packing in the rectum which will come out with the first bowel movement.  e. You will often notice bleeding with bowel movements.  This should slow down by the end of the first week of surgery f. Expect some drainage.  This should slow down, too, by the end of the first week of surgery.  Wear an absorbent pad or soft cotton gauze in your underwear until the drainage stops. 6. ACTIVITIES as tolerated:   a. You may resume regular (light) daily activities beginning the next day-such as daily self-care, walking, climbing stairs-gradually increasing activities as tolerated.  If you can walk 30 minutes  without difficulty, it is safe to try more intense activity such as jogging, treadmill, bicycling, low-impact aerobics, swimming, etc. b. Save the most intensive and strenuous activity for last such as sit-ups, heavy lifting, contact sports, etc  Refrain from any heavy lifting or straining until you are off narcotics for pain control.   c. DO NOT PUSH THROUGH PAIN.  Let pain be  your guide: If it hurts to do something, don't do it.  Pain is your body warning you to avoid that activity for another week until the pain goes down. d. You may drive when you are no longer taking prescription pain medication, you can comfortably sit for long periods of time, and you can safely maneuver your car and apply brakes. e. Bonita Quin may have sexual intercourse when it is comfortable.  7. FOLLOW UP in our office a. Please call [facility] at [phone] to set up an appointment to see your surgeon in the office for a follow-up appointment approximately 2 weeks after your surgery. b. Make sure that you call for this appointment the day you arrive home to insure a convenient appointment time. 10. IF YOU HAVE DISABILITY OR FAMILY LEAVE FORMS, BRING THEM TO THE OFFICE FOR PROCESSING.  DO NOT GIVE THEM TO YOUR DOCTOR.        WHEN TO CALL us [phone]: 1. Poor pain control 2. Reactions / problems with new medications (rash/itching, nausea, etc)  3. Fever over 101.5 F (38.5 C) 4. Inability to urinate 5. Nausea and/or vomiting 6. Worsening swelling or bruising 7. Continued bleeding from incision. 8. Increased pain, redness, or drainage from the incision  The clinic staff is available to answer your questions during regular business hours (8:30am-5pm).  Please don't hesitate to call and ask to speak to one of our nurses for clinical concerns.   A surgeon from [facility] is always on call at the hospitals   If you have a medical emergency, go to the nearest emergency room or call 911.    [facility] [address], [address], [place], [place]  [address] ? MAIN: [phone] ? TOLL FREE: [phone] ? FAX [phone] [email or web address]

## 2012-07-12 ENCOUNTER — Other Ambulatory Visit: Payer: Self-pay | Admitting: Obstetrics and Gynecology

## 2012-07-12 DIAGNOSIS — Z1231 Encounter for screening mammogram for malignant neoplasm of breast: Secondary | ICD-10-CM

## 2012-08-16 ENCOUNTER — Ambulatory Visit
Admission: RE | Admit: 2012-08-16 | Discharge: 2012-08-16 | Disposition: A | Discharge status: Home / Self Care | Payer: 59 | Source: Ambulatory Visit | Attending: Obstetrics and Gynecology | Admitting: Obstetrics and Gynecology

## 2012-08-16 DIAGNOSIS — Z1231 Encounter for screening mammogram for malignant neoplasm of breast: Secondary | ICD-10-CM

## 2012-08-17 ENCOUNTER — Other Ambulatory Visit: Payer: Self-pay | Admitting: Obstetrics and Gynecology

## 2012-08-17 DIAGNOSIS — N63 Unspecified lump in unspecified breast: Secondary | ICD-10-CM

## 2012-08-17 DIAGNOSIS — R928 Other abnormal and inconclusive findings on diagnostic imaging of breast: Secondary | ICD-10-CM

## 2012-08-30 ENCOUNTER — Ambulatory Visit
Admission: RE | Admit: 2012-08-30 | Discharge: 2012-08-30 | Disposition: A | Discharge status: Home / Self Care | Payer: 59 | Source: Ambulatory Visit | Attending: Obstetrics and Gynecology | Admitting: Obstetrics and Gynecology

## 2012-08-30 ENCOUNTER — Other Ambulatory Visit: Payer: Self-pay | Admitting: Obstetrics and Gynecology

## 2012-08-30 DIAGNOSIS — N63 Unspecified lump in unspecified breast: Secondary | ICD-10-CM

## 2013-08-15 ENCOUNTER — Ambulatory Visit
Admission: RE | Admit: 2013-08-15 | Discharge: 2013-08-15 | Disposition: A | Discharge status: Home / Self Care | Payer: 59 | Source: Ambulatory Visit | Attending: Family | Admitting: Family

## 2013-08-15 ENCOUNTER — Other Ambulatory Visit: Payer: Self-pay | Admitting: Family

## 2013-08-15 DIAGNOSIS — M25561 Pain in right knee: Secondary | ICD-10-CM

## 2013-08-31 ENCOUNTER — Ambulatory Visit: Payer: 59

## 2013-08-31 ENCOUNTER — Other Ambulatory Visit: Payer: Self-pay | Admitting: Obstetrics and Gynecology

## 2013-08-31 DIAGNOSIS — Z1231 Encounter for screening mammogram for malignant neoplasm of breast: Secondary | ICD-10-CM

## 2013-10-05 ENCOUNTER — Ambulatory Visit: Payer: 59

## 2016-03-23 DIAGNOSIS — M25561 Pain in right knee: Secondary | ICD-10-CM | POA: Insufficient documentation

## 2016-03-23 DIAGNOSIS — G8929 Other chronic pain: Secondary | ICD-10-CM | POA: Insufficient documentation

## 2016-04-07 DIAGNOSIS — D509 Iron deficiency anemia, unspecified: Secondary | ICD-10-CM | POA: Insufficient documentation

## 2016-07-22 ENCOUNTER — Ambulatory Visit (INDEPENDENT_AMBULATORY_CARE_PROVIDER_SITE_OTHER): Payer: Commercial Managed Care - HMO | Admitting: Gastroenterology

## 2016-07-22 ENCOUNTER — Encounter (INDEPENDENT_AMBULATORY_CARE_PROVIDER_SITE_OTHER): Payer: Self-pay

## 2016-07-22 ENCOUNTER — Encounter: Payer: Self-pay | Admitting: Gastroenterology

## 2016-07-22 VITALS — BP 100/60 | HR 68 | Ht 63.0 in | Wt 177.0 lb

## 2016-07-22 DIAGNOSIS — G8929 Other chronic pain: Secondary | ICD-10-CM | POA: Diagnosis not present

## 2016-07-22 DIAGNOSIS — K5900 Constipation, unspecified: Secondary | ICD-10-CM

## 2016-07-22 DIAGNOSIS — D509 Iron deficiency anemia, unspecified: Secondary | ICD-10-CM | POA: Diagnosis not present

## 2016-07-22 DIAGNOSIS — R140 Abdominal distension (gaseous): Secondary | ICD-10-CM | POA: Diagnosis not present

## 2016-07-22 DIAGNOSIS — R1013 Epigastric pain: Secondary | ICD-10-CM | POA: Diagnosis not present

## 2016-07-22 MED ORDER — NA SULFATE-K SULFATE-MG SULF 17.5-3.13-1.6 GM/177ML PO SOLN: 1.0000 | Freq: Once | ORAL | 0 refills | Status: AC

## 2016-07-22 NOTE — Patient Instructions (Signed)
If you are age 46 or older, your body mass index should be between 23-30. Your Body mass index is 31.35 kg/m. If this is out of the aforementioned range listed, please consider follow up with your Primary Care Provider.  If you are age 46 or younger, your body mass index should be between 19-25. Your Body mass index is 31.35 kg/m. If this is out of the aformentioned range listed, please consider follow up with your Primary Care Provider.   You have been scheduled for an endoscopy and colonoscopy. Please follow the written instructions given to you at your visit today. Please pick up your prep supplies at the pharmacy within the next 1-3 days. If you use inhalers (even only as needed), please bring them with you on the day of your procedure. Your physician has requested that you go to www.[email or web address] and enter the access code given to you at your visit today. This web site gives a general overview about your procedure. However, you should still follow specific instructions given to you by our office regarding your preparation for the procedure.  Thank you for choosing [facility]  Dr Amada JupiterHenry Danis III

## 2016-07-22 NOTE — Progress Notes (Signed)
[facility] Consult Note:  History: Denise Roth 07/22/2016  Referring physician: Everardo Beals, NP  Reason for consult/chief complaint: Abdominal Pain (burning sensation in epigastrium which can wake pt at night; eating seems to help the burning sensation); Bloated (lasts for more than a week at a time); and Constipation (feels like she has needs to have a bm but is unable to; when she does have bm, it is small, thin; no blood in stool)   Subjective  HPI:  This is a 46 year old woman referred by primary care for a constellation of GI symptoms. She last saw Dr. Deatra Ina in September 2013 for a persistent anal fissure. Flexible sigmoidoscopy with sphincter Botox injection was performed, but did not help heal her fistula. She ended up having a sphincterotomy with Dr. gross about a month later. The problem resolved, but for about the last year she is bothered by worsening chronic constipation with abdominal bloating. She will have urge for bowel movement a couple of times a week, but only passes small pellet-like or thin stools with no rectal bleeding. She is tried several OTC remedies that might help for a short while. As a separate symptom, she has a burning sensation in the epigastrium that might wake her at night. It might feel a little better with eating. She underwent gastric bypass surgery in early 2013 and had a good response with over 100 pounds of weight loss. She will intermittently have "dumping" with diarrhea and cramps that occur with certain foods. She generally knows what to avoid and has experienced these symptoms since her gastric bypass.  ROS:  Review of Systems  Constitutional: Negative for appetite change and unexpected weight change.  HENT: Negative for mouth sores and voice change.   Eyes: Negative for pain and redness.  Respiratory: Negative for cough and shortness of breath.   Cardiovascular: Negative for chest pain and palpitations.    Genitourinary: Negative for dysuria and hematuria.  Musculoskeletal: Negative for arthralgias and myalgias.  Skin: Negative for pallor and rash.  Neurological: Negative for weakness and headaches.  Hematological: Negative for adenopathy.     Past Medical History: Past Medical History:  Diagnosis Date  . Anal fissure   . History of gallstones   . Hypertension   . Obesity   . PCOS (polycystic ovarian syndrome)   . Perianal abscess   . PONV (postoperative nausea and vomiting)      Past Surgical History: Past Surgical History:  Procedure Laterality Date  . ANAL EXAMINATION UNDER ANESTHESIA  01/04/12   sphincterotomy by Dr. Johney Maine  . BREAST REDUCTION SURGERY    . CARPAL TUNNEL RELEASE     bilateral  . CHOLECYSTECTOMY    . EAR CYST EXCISION     left  . FLEXIBLE SIGMOIDOSCOPY  12/01/2011   Procedure: FLEXIBLE SIGMOIDOSCOPY;  Surgeon: Inda Castle, MD;  Location: [facility];  Service: Endoscopy;  Laterality: N/A;  . GASTRIC BYPASS    . NASAL SEPTUM SURGERY    . TONSILLECTOMY    . TUBAL LIGATION       Family History: Family History  Problem Relation Age of Onset  . Breast cancer Paternal Aunt   . Colon polyps Maternal Grandfather   . Diabetes Father   . Hypertension Father   . Heart disease Father   . Hypertension Mother     Social History: Social History   Social History  . Marital status: Married    Spouse name: N/A  . Number of children: 2  .  Years of education: N/A   Occupational History  . transportation planning Unemployed   Social History Main Topics  . Smoking status: Never Smoker  . Smokeless tobacco: Never Used  . Alcohol use Yes     Comment: rare  . Drug use: No  . Sexual activity: Not Asked   Other Topics Concern  . None   Social History Narrative  . None    Allergies: No Known Allergies  Outpatient Meds: Current Outpatient Prescriptions  Medication Sig Dispense Refill  . bismuth subsalicylate (PEPTO BISMOL) 262 MG/15ML  suspension Take 2-4 tablespoons as needed    . Calcium Carbonate Antacid (TUMS ULTRA 1000 PO) Take 4 tablets by mouth as needed.     . cholecalciferol (VITAMIN D) 1000 UNITS tablet Take 1,000 Units by mouth daily.    . hydrochlorothiazide (HYDRODIURIL) 25 MG tablet Take 25 mg by mouth daily.    Marland Kitchen loperamide (IMODIUM) 1 MG/5ML solution Take by mouth as needed for diarrhea or loose stools.    . Multiple Vitamin (MULTIVITAMIN) tablet Take 1 tablet by mouth daily.    . Na Sulfate-K Sulfate-Mg Sulf 17.5-3.13-1.6 GM/180ML SOLN Take 1 kit by mouth once. 354 mL 0   No current facility-administered medications for this visit.       ___________________________________________________________________ Objective   Exam:  BP 100/60   Pulse 68   Ht '5\' 3"'  (1.6 m)   Wt 177 lb (80.3 kg)   BMI 31.35 kg/m    General: this is a(n) Well-appearing woman, no muscle wasting   Eyes: sclera anicteric, no redness  ENT: oral mucosa moist without lesions, no cervical or supraclavicular lymphadenopathy, good dentition  CV: RRR without murmur, S1/S2, no JVD, no peripheral edema  Resp: clear to auscultation bilaterally, normal RR and effort noted  GI: soft, mild epigastric tenderness, with active bowel sounds. No guarding or palpable organomegaly noted.  Skin; warm and dry, no rash or jaundice noted  Neuro: awake, alert and oriented x 3. Normal gross motor function and fluent speech Rectal (chaperoned by our MA Dottie): Normal sphincter tone, no fissure or stricture, no palpable internal lesions, soft stool.  Labs:  Hemoglobin 10 with a low MCV earlier this year  No radiologic studies for review  Assessment: Encounter Diagnoses  Name Primary?  . Abdominal pain, chronic, epigastric Yes  . Abdominal bloating   . Constipation, unspecified constipation type   . Iron deficiency anemia, unspecified iron deficiency anemia type     We discussed possible causes of constipation. It does not seem to be  medicine side effect, as she only takes calcium intermittently. It may be functional/motility, anorectal dysfunction or structural/anatomical. Overall, I think it is most likely to be functional. Epigastric pain is somewhat difficult to characterize. Must rule out GBP anastomotic ulcer. Her anemia is from malabsorption due to gastric bypass. She admits to only taking iron tablets intermittently because they worsened constipation.  Plan:  EGD and colonoscopy  The benefits and risks of the planned procedure were described in detail with the patient or (when appropriate) their health care proxy.  Risks were outlined as including, but not limited to, bleeding, infection, perforation, adverse medication reaction leading to cardiac or pulmonary decompensation, or pancreatitis (if ERCP).  The limitation of incomplete mucosal visualization was also discussed.  No guarantees or warranties were given.  B12, iron studies and CBC If iron levels significant below, may need IV iron treatments.    Thank you for the courtesy of this consult.  Please call  me with any questions or concerns.  Nelida Meuse III  CC: Everardo Beals, NP

## 2016-07-23 ENCOUNTER — Telehealth: Payer: Self-pay | Admitting: Gastroenterology

## 2016-07-23 NOTE — Telephone Encounter (Signed)
Patient was notified and aware to have labs. She states understanding and will try to come to day or early next week.

## 2016-07-26 ENCOUNTER — Other Ambulatory Visit (INDEPENDENT_AMBULATORY_CARE_PROVIDER_SITE_OTHER): Payer: Commercial Managed Care - HMO

## 2016-07-26 DIAGNOSIS — R1013 Epigastric pain: Secondary | ICD-10-CM | POA: Diagnosis not present

## 2016-07-26 DIAGNOSIS — K5900 Constipation, unspecified: Secondary | ICD-10-CM | POA: Diagnosis not present

## 2016-07-26 DIAGNOSIS — R140 Abdominal distension (gaseous): Secondary | ICD-10-CM

## 2016-07-26 DIAGNOSIS — G8929 Other chronic pain: Secondary | ICD-10-CM | POA: Diagnosis not present

## 2016-07-26 DIAGNOSIS — D509 Iron deficiency anemia, unspecified: Secondary | ICD-10-CM

## 2016-07-26 LAB — CBC WITH DIFFERENTIAL/PLATELET
Basophils Absolute: 0.1 10*3/uL (ref 0.0–0.1)
Basophils Relative: 1.6 % (ref 0.0–3.0)
Eosinophils Absolute: 0.3 10*3/uL (ref 0.0–0.7)
Eosinophils Relative: 3.8 % (ref 0.0–5.0)
HCT: 30.6 % — ABNORMAL LOW (ref 36.0–46.0)
Hemoglobin: 9.4 g/dL — ABNORMAL LOW (ref 12.0–15.0)
Lymphocytes Relative: 32.1 % (ref 12.0–46.0)
Lymphs Abs: 2.5 10*3/uL (ref 0.7–4.0)
MCHC: 30.7 g/dL (ref 30.0–36.0)
MCV: 71.6 fl — AB (ref 78.0–100.0)
MONO ABS: 0.7 10*3/uL (ref 0.1–1.0)
Monocytes Relative: 8.9 % (ref 3.0–12.0)
NEUTROS PCT: 53.6 % (ref 43.0–77.0)
Neutro Abs: 4.2 10*3/uL (ref 1.4–7.7)
Platelets: 413 10*3/uL — ABNORMAL HIGH (ref 150.0–400.0)
RBC: 4.27 Mil/uL (ref 3.87–5.11)
RDW: 15.7 % — AB (ref 11.5–15.5)
WBC: 7.9 10*3/uL (ref 4.0–10.5)

## 2016-07-26 LAB — FERRITIN: Ferritin: 4.1 ng/mL — ABNORMAL LOW (ref 10.0–291.0)

## 2016-07-26 LAB — IBC PANEL
Iron: 11 ug/dL — ABNORMAL LOW (ref 42–145)
SATURATION RATIOS: 1.9 % — AB (ref 20.0–50.0)
Transferrin: 406 mg/dL — ABNORMAL HIGH (ref 212.0–360.0)

## 2016-07-26 LAB — VITAMIN B12: Vitamin B-12: 603 pg/mL (ref 211–911)

## 2016-07-26 LAB — FOLATE: Folate: 19.3 ng/mL (ref >5.9)

## 2016-07-27 ENCOUNTER — Other Ambulatory Visit: Payer: Self-pay

## 2016-07-27 DIAGNOSIS — D508 Other iron deficiency anemias: Secondary | ICD-10-CM

## 2016-07-28 ENCOUNTER — Encounter: Payer: Commercial Managed Care - HMO | Admitting: Gastroenterology

## 2016-07-28 ENCOUNTER — Ambulatory Visit (AMBULATORY_SURGERY_CENTER): Payer: Commercial Managed Care - HMO | Admitting: Gastroenterology

## 2016-07-28 ENCOUNTER — Encounter: Payer: Self-pay | Admitting: Gastroenterology

## 2016-07-28 VITALS — BP 125/85 | HR 74 | Temp 97.3°F | Resp 14 | Ht 63.0 in | Wt 177.0 lb

## 2016-07-28 DIAGNOSIS — R1030 Lower abdominal pain, unspecified: Secondary | ICD-10-CM | POA: Diagnosis not present

## 2016-07-28 DIAGNOSIS — K5900 Constipation, unspecified: Secondary | ICD-10-CM

## 2016-07-28 DIAGNOSIS — R1013 Epigastric pain: Secondary | ICD-10-CM | POA: Diagnosis not present

## 2016-07-28 MED ORDER — LINACLOTIDE 145 MCG PO CAPS: ORAL_CAPSULE | ORAL | 0 refills | Status: DC

## 2016-07-28 MED ORDER — SODIUM CHLORIDE 0.9 % IV SOLN: 500.0000 mL | INTRAVENOUS | Status: DC

## 2016-07-28 NOTE — Patient Instructions (Signed)
Repeat colonoscopy in 10 years.  Linzess 145 mcg by mouth daily for 2 weeks. Pick up from your pharmacy.  Call us with any questions or concerns. Thank you!!    YOU HAD AN ENDOSCOPIC PROCEDURE TODAY AT THE [facility]:   Refer to the procedure report that was given to you for any specific questions about what was found during the examination.  If the procedure report does not answer your questions, please call your gastroenterologist to clarify.  If you requested that your care partner not be given the details of your procedure findings, then the procedure report has been included in a sealed envelope for you to review at your convenience later.  YOU SHOULD EXPECT: Some feelings of bloating in the abdomen. Passage of more gas than usual.  Walking can help get rid of the air that was put into your GI tract during the procedure and reduce the bloating. If you had a lower endoscopy (such as a colonoscopy or flexible sigmoidoscopy) you may notice spotting of blood in your stool or on the toilet paper. If you underwent a bowel prep for your procedure, you may not have a normal bowel movement for a few days.  Please Note:  You might notice some irritation and congestion in your nose or some drainage.  This is from the oxygen used during your procedure.  There is no need for concern and it should clear up in a day or so.  SYMPTOMS TO REPORT IMMEDIATELY:   Following lower endoscopy (colonoscopy or flexible sigmoidoscopy):  Excessive amounts of blood in the stool  Significant tenderness or worsening of abdominal pains  Swelling of the abdomen that is new, acute  Fever of 100F or higher   Following upper endoscopy (EGD)  Vomiting of blood or coffee ground material  New chest pain or pain under the shoulder blades  Painful or persistently difficult swallowing  New shortness of breath  Fever of 100F or higher  Black, tarry-looking stools  For urgent or emergent issues, a  gastroenterologist can be reached at any hour by calling [phone].   DIET:  We do recommend a small meal at first, but then you may proceed to your regular diet.  Drink plenty of fluids but you should avoid alcoholic beverages for 24 hours.  ACTIVITY:  You should plan to take it easy for the rest of today and you should NOT DRIVE or use heavy machinery until tomorrow (because of the sedation medicines used during the test).    FOLLOW UP: Our staff will call the number listed on your records the next business day following your procedure to check on you and address any questions or concerns that you may have regarding the information given to you following your procedure. If we do not reach you, we will leave a message.  However, if you are feeling well and you are not experiencing any problems, there is no need to return our call.  We will assume that you have returned to your regular daily activities without incident.  If any biopsies were taken you will be contacted by phone or by letter within the next 1-3 weeks.  Please call us at [phone] if you have not heard about the biopsies in 3 weeks.    SIGNATURES/CONFIDENTIALITY: You and/or your care partner have signed paperwork which will be entered into your electronic medical record.  These signatures attest to the fact that that the information above on your After Visit Summary has  been reviewed and is understood.  Full responsibility of the confidentiality of this discharge information lies with you and/or your care-partner.

## 2016-07-28 NOTE — Op Note (Signed)
[facility] Patient Name: Denise Roth Procedure Date: 07/28/2016 7:25 AM MRN: [id number] Endoscopist: Sherilyn CooterHenry L. Myrtie Neitheranis , MD Age: 46 Referring MD:  Date of Birth: 1970/08/15 Gender: Female Account #: [id number] Procedure:                Upper GI endoscopy Indications:              Epigastric abdominal pain Medicines:                Monitored Anesthesia Care Procedure:                Pre-Anesthesia Assessment:                           - Prior to the procedure, a History and Physical                            was performed, and patient medications and                            allergies were reviewed. The patient's tolerance of                            previous anesthesia was also reviewed. The risks                            and benefits of the procedure and the sedation                            options and risks were discussed with the patient.                            All questions were answered, and informed consent                            was obtained. Prior Anticoagulants: The patient has                            taken no previous anticoagulant or antiplatelet                            agents. ASA Grade Assessment: II - A patient with                            mild systemic disease. After reviewing the risks                            and benefits, the patient was deemed in                            satisfactory condition to undergo the procedure.                           After obtaining informed consent, the endoscope was  passed under direct vision. Throughout the                            procedure, the patient's blood pressure, pulse, and                            oxygen saturations were monitored continuously. The                            Model GIF-HQ190 [id number]) scope was introduced                            through the mouth, and advanced to the afferent and                            efferent jejunal loops. The  upper GI endoscopy was                            accomplished without difficulty. The patient                            tolerated the procedure well. Scope In: Scope Out: Findings:                 The larynx was normal.                           The esophagus was normal.                           Evidence of a gastric bypass was found. A gastric                            pouch with a normal size was found. The                            gastrojejunal anastomosis was characterized by                            healthy appearing mucosa. There were both afferent                            and efferent limbs anastomosed to the gastric                            remnant.                           The examined jejunum was normal. Complications:            No immediate complications. Estimated Blood Loss:     Estimated blood loss: none. Impression:               - Normal larynx.                           - Normal esophagus.                           -  Gastric bypass with a normal-sized pouch.                            Gastrojejunal anastomosis characterized by healthy                            appearing mucosa.                           - Normal examined jejunum.                           - No specimens collected.                           No ulcer seen. Recommendation:           - Patient has a contact number available for                            emergencies. The signs and symptoms of potential                            delayed complications were discussed with the                            patient. Return to normal activities tomorrow.                            Written discharge instructions were provided to the                            patient.                           - Resume previous diet.                           - Continue present medications.                           - See the other procedure note for documentation of                            additional  recommendations. Malachai Schalk L. Myrtie Neither, MD 07/28/2016 8:20:05 AM This report has been signed electronically.

## 2016-07-28 NOTE — Op Note (Signed)
[facility] Patient Name: Denise GarreSherria Hoffmeister Procedure Date: 07/28/2016 7:25 AM MRN: [id number] Endoscopist: Sherilyn CooterHenry L. Myrtie Neitheranis , MD Age: [age] Referring MD:  Date of Birth: 01/19/1971 Gender: Female Account #: [id number] Procedure:                Colonoscopy Indications:              Lower abdominal pain, Constipation Medicines:                Monitored Anesthesia Care Procedure:                Pre-Anesthesia Assessment:                           - Prior to the procedure, a History and Physical                            was performed, and patient medications and                            allergies were reviewed. The patient's tolerance of                            previous anesthesia was also reviewed. The risks                            and benefits of the procedure and the sedation                            options and risks were discussed with the patient.                            All questions were answered, and informed consent                            was obtained. Prior Anticoagulants: The patient has                            taken no previous anticoagulant or antiplatelet                            agents. ASA Grade Assessment: II - A patient with                            mild systemic disease. After reviewing the risks                            and benefits, the patient was deemed in                            satisfactory condition to undergo the procedure.                           After obtaining informed consent, the colonoscope  was passed under direct vision. Throughout the                            procedure, the patient's blood pressure, pulse, and                            oxygen saturations were monitored continuously. The                            Colonoscope was introduced through the anus and                            advanced to the the cecum, identified by                            appendiceal orifice and ileocecal  valve. The                            colonoscopy was performed without difficulty. The                            patient tolerated the procedure well. The quality                            of the bowel preparation was excellent. The                            ileocecal valve, appendiceal orifice, and rectum                            were photographed. The quality of the bowel                            preparation was evaluated using the BBPS [facility]                            Bowel Preparation Scale) with scores of: Right                            Colon = 3, Transverse Colon = 3 and Left Colon = 3                            (entire mucosa seen well with no residual staining,                            small fragments of stool or opaque liquid). The                            total BBPS score equals 9. The bowel preparation                            used was SUPREP. Scope In: 8:03:27 AM Scope Out: 8:14:05 AM Scope Withdrawal Time: 0 hours 7  minutes 27 seconds  Total Procedure Duration: 0 hours 10 minutes 38 seconds  Findings:                 The perianal and digital rectal examinations were                            normal.                           The entire examined colon appeared normal on direct                            and retroflexion views. Complications:            No immediate complications. Estimated Blood Loss:     Estimated blood loss: none. Impression:               - The entire examined colon is normal on direct and                            retroflexion views.                           - No specimens collected. Recommendation:           - Patient has a contact number available for                            emergencies. The signs and symptoms of potential                            delayed complications were discussed with the                            patient. Return to normal activities tomorrow.                            Written discharge instructions were  provided to the                            patient.                           - Resume previous diet.                           - Continue present medications.                           - Repeat colonoscopy in 10 years for screening                            purposes.                           - Use Linzess (linaclotide) 145 mcg PO daily for 2  weeks. Disp #14, RF zero Junaid Wurzer L. Myrtie Neither, MD 07/28/2016 8:24:13 AM This report has been signed electronically.

## 2016-07-28 NOTE — Progress Notes (Signed)
  [facility] Anesthesia Post-op Note  Patient: Denise Roth  Procedure(s) Performed: colonoscopy and endoscopy  Patient Location: [facility] - Recovery Area  Anesthesia Type: Deep Sedation/Propofol  Level of Consciousness: awake, oriented and patient cooperative  Airway and Oxygen Therapy: Patient Spontanous Breathing  Post-op Pain: none  Post-op Assessment:  Post-op Vital signs reviewed, Patient's Cardiovascular Status Stable, Respiratory Function Stable, Patent Airway, No signs of Nausea or vomiting and Pain level controlled  Post-op Vital Signs: Reviewed and stable  Complications: No apparent anesthesia complications  Lucretia Pendley E Jaquelyne Firkus 8:21 AM

## 2016-07-29 ENCOUNTER — Telehealth: Payer: Self-pay

## 2016-07-29 NOTE — Telephone Encounter (Signed)
  Follow up Call-  Call back number 07/28/2016  Post procedure Call Back phone  # [phone]  Permission to leave phone message Yes  Some recent data might be hidden     Patient questions:  Do you have a fever, pain , or abdominal swelling? No. Pain Score  0 *  Have you tolerated food without any problems? Yes.    Have you been able to return to your normal activities? Yes.    Do you have any questions about your discharge instructions: Diet   No. Medications  No. Follow up visit  No.  Do you have questions or concerns about your Care? No.  Actions: * If pain score is 4 or above: No action needed, pain <4.

## 2016-08-09 ENCOUNTER — Telehealth: Payer: Self-pay | Admitting: Gastroenterology

## 2016-08-09 ENCOUNTER — Other Ambulatory Visit: Payer: Self-pay

## 2016-08-09 DIAGNOSIS — K5900 Constipation, unspecified: Secondary | ICD-10-CM

## 2016-08-09 DIAGNOSIS — R1013 Epigastric pain: Secondary | ICD-10-CM

## 2016-08-09 MED ORDER — LINACLOTIDE 145 MCG PO CAPS: ORAL_CAPSULE | ORAL | 3 refills | Status: DC

## 2016-08-09 NOTE — Telephone Encounter (Signed)
Refilled as directed.  

## 2016-08-09 NOTE — Telephone Encounter (Signed)
Yes, a month supply of 145 micrograms once daily  and 3 refills.

## 2016-08-09 NOTE — Telephone Encounter (Signed)
Routed to Ashley. 

## 2016-08-09 NOTE — Telephone Encounter (Signed)
Pt is requesting refill of Linzess. Are you ok with this?

## 2016-09-13 ENCOUNTER — Telehealth: Payer: Self-pay | Admitting: Gastroenterology

## 2016-09-13 NOTE — Telephone Encounter (Signed)
Labs faxed to me by rheumatology show that on 6/28 her Hgb was improved at 11.4  I think she should have a dose of IV iron and a check of the hemoglobin monthly for the next 3 months.

## 2016-09-14 NOTE — Telephone Encounter (Signed)
Do you want the pt to receive one dose of Feraheme? Please advise.

## 2016-09-16 NOTE — Telephone Encounter (Signed)
Yes, please continue one dose of fereheme monthly  X 3  at same dose as she recently received. Please check the date of the most recent treatment so we know when next is approximately due.

## 2016-09-16 NOTE — Telephone Encounter (Signed)
Dr. Myrtie Neitheranis the pt [facility] 705mg  [facility]. I will fax over orders for her to receive Injectafer 705mg  monthly for 3 months and cbc for 3 mths if you approve. Please advise.

## 2016-09-16 NOTE — Telephone Encounter (Signed)
Pt received Inject[facility]fer 705mg  [facility]. Message left with [facility] to call back regarding date of last tx and to give new orders.

## 2016-09-20 ENCOUNTER — Telehealth: Payer: Self-pay | Admitting: Gastroenterology

## 2016-09-20 NOTE — Telephone Encounter (Signed)
Yes, that is what I would like. Thank you for the clarification.  - H. Danis

## 2016-09-20 NOTE — Telephone Encounter (Signed)
Orders faxed, pt aware and knows to expect [facility] to schedule [facility]t.

## 2016-09-20 NOTE — Telephone Encounter (Signed)
Order faxed [phone]) over again with specific dosing of injectafer 705 mg IV monthly for 3 months and instructions to draw CBC monthly for 3 months.

## 2016-12-03 ENCOUNTER — Emergency Department: Payer: 59

## 2016-12-03 ENCOUNTER — Emergency Department
Admission: EM | Admit: 2016-12-03 | Discharge: 2016-12-03 | Disposition: A | Discharge status: Home / Self Care | Payer: 59 | Attending: Emergency Medicine | Admitting: Emergency Medicine

## 2016-12-03 DIAGNOSIS — R0602 Shortness of breath: Secondary | ICD-10-CM | POA: Insufficient documentation

## 2016-12-03 DIAGNOSIS — R059 Cough, unspecified: Secondary | ICD-10-CM

## 2016-12-03 DIAGNOSIS — R05 Cough: Secondary | ICD-10-CM | POA: Diagnosis not present

## 2016-12-03 DIAGNOSIS — R0981 Nasal congestion: Secondary | ICD-10-CM | POA: Diagnosis present

## 2016-12-03 DIAGNOSIS — R0600 Dyspnea, unspecified: Secondary | ICD-10-CM | POA: Diagnosis not present

## 2016-12-03 DIAGNOSIS — J00 Acute nasopharyngitis [common cold]: Secondary | ICD-10-CM | POA: Diagnosis not present

## 2016-12-03 DIAGNOSIS — R5381 Other malaise: Secondary | ICD-10-CM | POA: Insufficient documentation

## 2016-12-03 DIAGNOSIS — H938X3 Other specified disorders of ear, bilateral: Secondary | ICD-10-CM

## 2016-12-03 DIAGNOSIS — Z79899 Other long term (current) drug therapy: Secondary | ICD-10-CM | POA: Diagnosis not present

## 2016-12-03 DIAGNOSIS — I10 Essential (primary) hypertension: Secondary | ICD-10-CM | POA: Diagnosis not present

## 2016-12-03 MED ORDER — AZITHROMYCIN 250 MG PO TABS: ORAL_TABLET | ORAL | 0 refills | Status: AC

## 2016-12-03 MED ORDER — ALBUTEROL SULFATE HFA 108 (90 BASE) MCG/ACT IN AERS: 2.0000 | INHALATION_SPRAY | Freq: Four times a day (QID) | RESPIRATORY_TRACT | 2 refills | Status: DC | PRN

## 2016-12-03 NOTE — ED Notes (Signed)
See triage note  Presents with cough and congestion   States sx's started about 1 month ago  But sx's became worse over the past few days  Feels like she can't get a good breath  Cough is now occasionally prod

## 2016-12-03 NOTE — ED Provider Notes (Signed)
[facility] Emergency Department Provider Note   ____________________________________________   I have reviewed the triage vital signs and the nursing notes.   HISTORY  Chief Complaint URI and Nasal Congestion    HPI Denise Roth is a 46 y.o. female Presents to emergency department with 1 month history of nasal congestion, sinus pressure, nonproductive cough, not feeling well. [facility] course of amoxicillin that she did not complete and the dose of Kenalog. Patient also was seen by dermatologist for rash on her left forearm and left lower leg where she was given a course of a prednisone taper. She did complete that medication. Patient reports today since of malaise, inability to catch her breath/shortness of breath with exertion and the above symptoms. Patient denies seasonal or by her mental allergies, change of environment that would calls upper respiratory symptoms. Patient denies fever, chills, headache, vision changes, chest pain, chest tightness, shortness of breath, abdominal pain, nausea and vomiting.  Past Medical History:  Diagnosis Date  . Anal fissure   . Anemia   . History of gallstones   . Hypertension   . Obesity   . PCOS (polycystic ovarian syndrome)   . Perianal abscess   . PONV (postoperative nausea and vomiting)     Patient Active Problem List   Diagnosis Date Noted  . Anal fissure s/p LLsphincterotomy 01/04/2012 11/22/2011  . Dyspepsia 11/22/2011  . Anemia 11/22/2011    Past Surgical History:  Procedure Laterality Date  . ANAL EXAMINATION UNDER ANESTHESIA  01/04/12   sphincterotomy by Dr. Michaell Cowing  . BREAST REDUCTION SURGERY    . CARPAL TUNNEL RELEASE     bilateral  . CHOLECYSTECTOMY    . EAR CYST EXCISION     left  . FLEXIBLE SIGMOIDOSCOPY  12/01/2011   Procedure: FLEXIBLE SIGMOIDOSCOPY;  Surgeon: Louis Meckel, MD;  Location: [facility];  Service: Endoscopy;  Laterality: N/A;  .  GASTRIC BYPASS    . NASAL SEPTUM SURGERY    . TONSILLECTOMY    . TUBAL LIGATION      Prior to Admission medications   Medication Sig Start Date End Date Taking? Authorizing Provider  albuterol (PROVENTIL HFA;VENTOLIN HFA) 108 (90 Base) MCG/ACT inhaler Inhale 2 puffs into the lungs every 6 (six) hours as needed for wheezing or shortness of breath. 12/03/16   Little, Traci M, PA-C  azithromycin (ZITHROMAX Z-PAK) 250 MG tablet Take 2 tablets (500 mg) on  Day 1,  followed by 1 tablet (250 mg) once daily on Days 2 through 5. 12/03/16 12/08/16  Little, Traci M, PA-C  bismuth subsalicylate (PEPTO BISMOL) 262 MG/15ML suspension Take 2-4 tablespoons as needed    [provider]  Calcium Carbonate Antacid (TUMS ULTRA 1000 PO) Take 4 tablets by mouth as needed.     [provider]  cholecalciferol (VITAMIN D) 1000 UNITS tablet Take 1,000 Units by mouth daily.    [provider]  hydrochlorothiazide (HYDRODIURIL) 25 MG tablet Take 25 mg by mouth daily.    [provider]  linaclotide Karlene Einstein) 145 MCG CAPS capsule 1 tablet daily for 2 weeks 08/09/16   Sherrilyn Rist, MD  loperamide (IMODIUM) 1 MG/5ML solution Take by mouth as needed for diarrhea or loose stools.    [provider]  Multiple Vitamin (MULTIVITAMIN) tablet Take 1 tablet by mouth daily.    [provider]    Allergies Patient has no known allergies.  Family History  Problem  Relation Age of Onset  . Breast cancer Paternal Aunt   . Colon polyps Maternal Grandfather   . Diabetes Father   . Hypertension Father   . Heart disease Father   . Hypertension Mother   . Colon cancer Neg Hx     Social History Social History  Substance Use Topics  . Smoking status: Never Smoker  . Smokeless tobacco: Never Used  . Alcohol use Yes     Comment: holidays    Review of Systems Constitutional: Negative for fever/chills Eyes: No visual changes. ENT: Positive for sore throat and negative for  difficulty swallowing. Positive for nasal congestion and sinus pressure. Cardiovascular: Denies chest pain. Respiratory: nonproductive cough. Exertional shortness of breath and difficulty catching her breath. Gastrointestinal: No abdominal pain.  No nausea, vomiting, diarrhea. Genitourinary: Negative for dysuria. Musculoskeletal: Negative for back pain. Skin: Negative for rash. Neurological: Negative for headaches.  Negative focal weakness or numbness. Negative for loss of consciousness. Able to ambulate. ____________________________________________   PHYSICAL EXAM:  VITAL SIGNS: ED Triage Vitals  Enc Vitals Group     BP 12/03/16 0727 109/79     Pulse Rate 12/03/16 0727 84     Resp 12/03/16 0727 18     Temp 12/03/16 0727 98.6 F (37 C)     Temp Source 12/03/16 0727 Oral     SpO2 12/03/16 0727 98 %     Weight 12/03/16 0727 179 lb (81.2 kg)     Height 12/03/16 0727  (1.6 m)     Head Circumference --      Peak Flow --      Pain Score 12/03/16 0726 9     Pain Loc --      Pain Edu? --      Excl. in GC? --     Constitutional: Alert and oriented. Well appearing and in no acute distress.  Eyes: Conjunctivae are normal. PERRL. EOMI  Head: Normocephalic and atraumatic. ENT:      Ears: Canals clear. TMs intact bilaterally.      Nose: No congestion/rhinnorhea.      Mouth/Throat: Mucous membranes are moist. Oropharynx normal. Tonsils bilaterally symmetrical.  Neck:Supple. No thyromegaly. No stridor.  Cardiovascular: Normal rate, regular rhythm.  Good peripheral circulation. Respiratory: Normal respiratory effort without tachypnea or retractions. Lungs CTAB. No wheezes/rales/rhonchi. Good air entry to the bases with no decreased or absent breath sounds. Hematological/Lymphatic/Immunological: No cervical lymphadenopathy. Cardiovascular: Normal rate, regular rhythm. Normal distal pulses. Gastrointestinal: Bowel sounds 4 quadrants. Soft and nontender to palpation.  Neurologic:  Normal speech and language.  Skin:  Skin is warm, dry and intact. No rash noted. Psychiatric: Mood and affect are normal. Speech and behavior are normal. Patient exhibits appropriate insight and judgement.  ____________________________________________   LABS (all labs ordered are listed, but only abnormal results are displayed)  Labs Reviewed - No data to display ____________________________________________  EKG none ____________________________________________  RADIOLOGY DG chest 2 view FINDINGS: The heart size and mediastinal contours are within normal limits. Both lungs are clear. The visualized skeletal structures are unremarkable.  IMPRESSION: No active cardiopulmonary disease.  Review of imaging results unremarkable per results.  ____________________________________________   PROCEDURES  Procedure(s) performed: no    Critical Care performed: no ____________________________________________   INITIAL IMPRESSION / ASSESSMENT AND PLAN / ED COURSE  Pertinent labs & imaging results that were available during my care of the patient were reviewed by me and considered in my medical decision making (see chart for details).  Patient presents  to the emergency department with nasal congestion, sinus pressure, nonproductive cough, congestion of the ears and shortness of breath. History, physical exam are reassuring symptoms are consistent with nasopharyngitis with associated cough and congestion. Patient will be prescribed azithromycin for antibiotic coverage and offered her an albuterol inhaler as needed. Physical exam and vital signes were reassuring at this time. She has a scheduled appointment with her primary care doctor that she plans to attend next week. She will address current symptoms if they persist. Patient informed of clinical course, understand medical decision-making process, and agree with plan.  Patient was advised to return to the emergency department for  symptoms that change or worsen.      If controlled substance prescribed during this visit, 12 month history viewed on the [facility] prior to issuing an initial prescription for Schedule II or III opiod. ____________________________________________   FINAL CLINICAL IMPRESSION(S) / ED DIAGNOSES  Final diagnoses:  Acute nasopharyngitis  Cough  Congestion of both ears  Dyspnea, unspecified type       NEW MEDICATIONS STARTED DURING THIS VISIT:  Discharge Medication List as of 12/03/2016  8:54 AM    START taking these medications   Details  albuterol (PROVENTIL HFA;VENTOLIN HFA) 108 (90 Base) MCG/ACT inhaler Inhale 2 puffs into the lungs every 6 (six) hours as needed for wheezing or shortness of breath., Starting Fri 12/03/2016, Print    azithromycin (ZITHROMAX Z-PAK) 250 MG tablet Take 2 tablets (500 mg) on  Day 1,  followed by 1 tablet (250 mg) once daily on Days 2 through 5., Print         Note:  This document was prepared using Dragon voice recognition software and may include unintentional dictation errors.    Clois Comber, PA-C 12/03/16 1537    Merrily Brittle, MD 12/04/16 [phone]

## 2016-12-03 NOTE — Discharge Instructions (Signed)
Take medication as prescribed. Return to emergency department if symptoms worsen and follow-up with PCP as needed.    Recommend following up with your primary care provider is usually able to schedule an appointment.

## 2016-12-03 NOTE — ED Triage Notes (Signed)
Pt c/o right ear pain, URI sx, nonproductive cough. Pt was prescribed amoxicillin and did not finish prescription. Pt alert and oriented X4, active, cooperative, pt in NAD. RR even and unlabored, color WNL.

## 2017-01-19 ENCOUNTER — Telehealth: Payer: Self-pay | Admitting: Gastroenterology

## 2017-01-19 NOTE — Telephone Encounter (Signed)
Routed to Dr. Myrtie Neitheranis. Left message for Neysa BonitoChristy that we would contact them and the patient tomorrow.

## 2017-01-20 ENCOUNTER — Other Ambulatory Visit: Payer: Self-pay

## 2017-01-20 DIAGNOSIS — D649 Anemia, unspecified: Secondary | ICD-10-CM

## 2017-01-20 NOTE — Telephone Encounter (Signed)
Please see my reply

## 2017-01-20 NOTE — Telephone Encounter (Signed)
Called patient, unable to lvm as mailbox was full. Called Christy at rheumatology, lvm to see if patient had CBC and ferritin level in last month. I have placed labs orders here if needed.

## 2017-01-20 NOTE — Telephone Encounter (Signed)
If she has not had a CBC and a ferritin level in the last month with rheumatology, then please arrange for patient to them drawn at our lab within next 2 weeks.  Then I will decide about the iron.

## 2017-01-21 ENCOUNTER — Other Ambulatory Visit: Payer: Self-pay

## 2017-01-21 NOTE — Telephone Encounter (Signed)
Spoke to patient, last labs were done 12/22/16 and no ferritin level. Asked her to come to our lab to have new CBC and ferritin level done. She will come in on Monday, 12/24/16. Rheumatology did fax over CBC results which are on Dr. Myrtie Neitheranis' desk.  10/17- Hgb 12.0   Hct  36.7  9/5-     Hgb  12.8  Hct  41.0

## 2017-01-21 NOTE — Telephone Encounter (Signed)
These labs are recent enough , and show that the hemoglobin is normal.  We will see labs next week.  If normal, she won't need more iron at this point and can follow up with primary care to further monitor her blood counts.  We'll let her know when we see results.

## 2017-01-24 ENCOUNTER — Other Ambulatory Visit (INDEPENDENT_AMBULATORY_CARE_PROVIDER_SITE_OTHER): Payer: 59

## 2017-01-24 DIAGNOSIS — D649 Anemia, unspecified: Secondary | ICD-10-CM

## 2017-01-24 LAB — CBC WITH DIFFERENTIAL/PLATELET
BASOS ABS: 0 10*3/uL (ref 0.0–0.1)
Basophils Relative: 0.3 % (ref 0.0–3.0)
EOS ABS: 0.1 10*3/uL (ref 0.0–0.7)
Eosinophils Relative: 0.5 % (ref 0.0–5.0)
HCT: 37.5 % (ref 36.0–46.0)
Hemoglobin: 12 g/dL (ref 12.0–15.0)
LYMPHS ABS: 1.1 10*3/uL (ref 0.7–4.0)
Lymphocytes Relative: 8 % — ABNORMAL LOW (ref 12.0–46.0)
MCHC: 32 g/dL (ref 30.0–36.0)
MCV: 92.4 fl (ref 78.0–100.0)
Monocytes Absolute: 1.3 10*3/uL — ABNORMAL HIGH (ref 0.1–1.0)
Monocytes Relative: 9.1 % (ref 3.0–12.0)
NEUTROS ABS: 11.6 10*3/uL — AB (ref 1.4–7.7)
NEUTROS PCT: 82.1 % — AB (ref 43.0–77.0)
PLATELETS: 266 10*3/uL (ref 150.0–400.0)
RBC: 4.06 Mil/uL (ref 3.87–5.11)
RDW: 13 % (ref 11.5–15.5)
WBC: 14.1 10*3/uL — ABNORMAL HIGH (ref 4.0–10.5)

## 2017-01-24 LAB — FERRITIN: Ferritin: 932.9 ng/mL — ABNORMAL HIGH (ref 10.0–291.0)

## 2017-04-13 DIAGNOSIS — L02419 Cutaneous abscess of limb, unspecified: Secondary | ICD-10-CM | POA: Diagnosis not present

## 2017-04-13 DIAGNOSIS — I10 Essential (primary) hypertension: Secondary | ICD-10-CM | POA: Diagnosis not present

## 2017-04-13 DIAGNOSIS — D509 Iron deficiency anemia, unspecified: Secondary | ICD-10-CM | POA: Diagnosis not present

## 2017-08-17 DIAGNOSIS — Z029 Encounter for administrative examinations, unspecified: Secondary | ICD-10-CM | POA: Diagnosis not present

## 2017-12-30 DIAGNOSIS — Z01419 Encounter for gynecological examination (general) (routine) without abnormal findings: Secondary | ICD-10-CM | POA: Diagnosis not present

## 2017-12-30 DIAGNOSIS — Z6827 Body mass index (BMI) 27.0-27.9, adult: Secondary | ICD-10-CM | POA: Diagnosis not present

## 2017-12-31 DIAGNOSIS — Z23 Encounter for immunization: Secondary | ICD-10-CM | POA: Diagnosis not present

## 2018-12-05 ENCOUNTER — Ambulatory Visit: Payer: 59 | Admitting: Sports Medicine

## 2018-12-05 ENCOUNTER — Ambulatory Visit (INDEPENDENT_AMBULATORY_CARE_PROVIDER_SITE_OTHER): Payer: 59

## 2018-12-05 ENCOUNTER — Other Ambulatory Visit: Payer: Self-pay

## 2018-12-05 ENCOUNTER — Encounter: Payer: Self-pay | Admitting: Sports Medicine

## 2018-12-05 VITALS — BP 101/69

## 2018-12-05 DIAGNOSIS — L603 Nail dystrophy: Secondary | ICD-10-CM

## 2018-12-05 DIAGNOSIS — L929 Granulomatous disorder of the skin and subcutaneous tissue, unspecified: Secondary | ICD-10-CM

## 2018-12-05 DIAGNOSIS — M779 Enthesopathy, unspecified: Secondary | ICD-10-CM | POA: Diagnosis not present

## 2018-12-05 DIAGNOSIS — M79671 Pain in right foot: Secondary | ICD-10-CM

## 2018-12-05 DIAGNOSIS — M7751 Other enthesopathy of right foot: Secondary | ICD-10-CM | POA: Diagnosis not present

## 2018-12-06 ENCOUNTER — Encounter: Payer: Self-pay | Admitting: Sports Medicine

## 2018-12-06 DIAGNOSIS — M79671 Pain in right foot: Secondary | ICD-10-CM | POA: Diagnosis not present

## 2018-12-06 DIAGNOSIS — M7751 Other enthesopathy of right foot: Secondary | ICD-10-CM | POA: Diagnosis not present

## 2018-12-06 MED ORDER — TRIAMCINOLONE ACETONIDE 10 MG/ML IJ SUSP
10.0000 mg | Freq: Once | INTRAMUSCULAR | Status: AC
  Administered 2018-12-06: 10 mg

## 2018-12-06 NOTE — Progress Notes (Signed)
Subjective: Dominika Losey Linhardt is a 48 y.o. female patient who presents to office for evaluation of right foot pain patient reports that there is pain that she has had over the bump on the side of her foot for the last month states that there is a shooting pain hard to walk at time when applying pressure to the area states that also there is some pain at the fifth toe and feels that her nail get snagged on a sock has tried trimming the right fifth toenail patient is also concerned about the thickness of other toenails and reports that sometimes the pain is off and on to these areas pain at worst 5 out of 10. Denies injury/trip/fall/sprain/any causative factors.   Review of Systems  All other systems reviewed and are negative.    Patient Active Problem List   Diagnosis Date Noted  . Anal fissure s/p LLsphincterotomy 01/04/2012 11/22/2011  . Dyspepsia 11/22/2011  . Anemia 11/22/2011    Current Outpatient Medications on File Prior to Visit  Medication Sig Dispense Refill  . albuterol (PROVENTIL HFA;VENTOLIN HFA) 108 (90 Base) MCG/ACT inhaler Inhale 2 puffs into the lungs every 6 (six) hours as needed for wheezing or shortness of breath. 1 Inhaler 2  . amoxicillin (AMOXIL) 875 MG tablet amoxicillin 875 mg tablet  Take 1 tablet every 12 hours by oral route for 10 days.    Marland Kitchen amoxicillin-clavulanate (AUGMENTIN) 875-125 MG tablet amoxicillin 875 mg-potassium clavulanate 125 mg tablet    . azithromycin (ZITHROMAX) 250 MG tablet azithromycin 250 mg tablet    . benzonatate (TESSALON) 200 MG capsule benzonatate 200 mg capsule    . bismuth subsalicylate (PEPTO BISMOL) 262 MG/15ML suspension Take 2-4 tablespoons as needed    . busPIRone (BUSPAR) 10 MG tablet buspirone 10 mg tablet    . Calcium Carbonate Antacid (TUMS ULTRA 1000 PO) Take 4 tablets by mouth as needed.     . chlorhexidine (PERIDEX) 0.12 % solution chlorhexidine gluconate 0.12 % mouthwash    . cholecalciferol (VITAMIN D) 1000 UNITS tablet Take  1,000 Units by mouth daily.    . clobetasol cream (TEMOVATE) 0.05 % clobetasol 0.05 % topical cream    . doxycycline (VIBRA-TABS) 100 MG tablet Take 100 mg by mouth 2 (two) times daily. for 10 days    . DULoxetine (CYMBALTA) 60 MG capsule Cymbalta 60 mg capsule,delayed release  Take 1 capsule every day by oral route for 30 days.    Marland Kitchen escitalopram (LEXAPRO) 20 MG tablet Lexapro 20 mg tablet  Take 1 tablet every day by oral route as directed for 30 days.    . fluconazole (DIFLUCAN) 150 MG tablet TAKE ONE TABLET BY MOUTH AS A ONE TIME DOSE    . fluconazole (DIFLUCAN) 150 MG tablet fluconazole 150 mg tablet  TAKE ONE TABLET BY MOUTH AS A ONE TIME DOSE    . fluticasone (FLONASE) 50 MCG/ACT nasal spray fluticasone propionate 50 mcg/actuation nasal spray,suspension    . hydrochlorothiazide (HYDRODIURIL) 25 MG tablet Take 25 mg by mouth daily.    Marland Kitchen HYDROcodone-acetaminophen (NORCO/VICODIN) 5-325 MG tablet hydrocodone 5 mg-acetaminophen 325 mg tablet    . hydrOXYzine (VISTARIL) 25 MG capsule Vistaril 25 mg capsule  Take 1 capsule every 6-8 hours by oral route as needed for 15 days.    Marland Kitchen ketoconazole (NIZORAL) 2 % cream ketoconazole 2 % topical cream  APPLY CREAM TOPICALLY TO AFFECTED AREA ONCE DAILY    . linaclotide (LINZESS) 145 MCG CAPS capsule 1 tablet daily for 2  weeks 30 capsule 3  . lisinopril-hydrochlorothiazide (ZESTORETIC) 20-25 MG tablet lisinopril 20 mg-hydrochlorothiazide 25 mg tablet    . loperamide (IMODIUM) 1 MG/5ML solution Take by mouth as needed for diarrhea or loose stools.    . methocarbamol (ROBAXIN) 500 MG tablet methocarbamol 500 mg tablet    . Multiple Vitamin (MULTIVITAMIN) tablet Take 1 tablet by mouth daily.     Current Facility-Administered Medications on File Prior to Visit  Medication Dose Route Frequency Provider Last Rate Last Dose  . 0.9 %  sodium chloride infusion  500 mL Intravenous Continuous Danis, Estill Cotta III, MD        No Known Allergies  Objective:   General: Alert and oriented x3 in no acute distress  Dermatology: No open lesions bilateral lower extremities, no webspace macerations, no ecchymosis bilateral, all nails x 10 are well manicured but thickened severely at bilateral fifth toes and left fourth toe there is evidence of an trim granuloma that the patient self treated at the base of the right fifth toenail with no surrounding signs of infection.  Vascular: Dorsalis Pedis and Posterior Tibial pedal pulses palpable, Capillary Fill Time 3 seconds,(+) pedal hair growth bilateral, no edema bilateral lower extremities, Temperature gradient within normal limits.  Neurology: Johney Maine sensation intact via light touch bilateral.  Musculoskeletal: Mild tenderness with palpation at spur at the dorsal lateral midtarsal joint at the base of the right fourth and fifth metatarsals, strength within normal limits in all groups bilateral.   Gait: Antalgic gait  Xrays  R Foot   Impression: Bone spur at the fourth fifth metatarsal base interval on the right foot, no other acute findings.  Assessment and Plan: Problem List Items Addressed This Visit    None    Visit Diagnoses    Right foot pain    -  Primary   Relevant Orders   DG Foot Complete Right   Capsulitis       Bone spur       Nail dystrophy       Granuloma of skin       R 5th toe       -Complete examination performed -Xrays reviewed -Discussed treatment options -After oral consent and aseptic prep, injected a mixture containing 1 ml of 2%  plain lidocaine, 1 ml 0.5% plain marcaine, 0.5 ml of kenalog 10 and 0.5 ml of dexamethasone phosphate into right foot at the midtarsal joint at the area of bony exostosis without complication. Post-injection care discussed with patient.  -Dispensed plantar fascial brace to use and instructed to offer some support to the midtarsal joint -Advised patient at next office visit we can proceed with doing a right fifth toenail avulsion procedure with  use of phenol at the nail root to kill of granuloma at nail base as well as take samples from all other thickened nails -Patient to return to office when ready for nail procedure at right fifth toe and fungal culture from remaining thickened nails or sooner if condition worsens.  Landis Martins, DPM

## 2018-12-19 ENCOUNTER — Other Ambulatory Visit: Payer: Self-pay

## 2018-12-19 ENCOUNTER — Ambulatory Visit: Payer: 59 | Admitting: Sports Medicine

## 2018-12-19 ENCOUNTER — Encounter: Payer: Self-pay | Admitting: Sports Medicine

## 2018-12-19 DIAGNOSIS — L603 Nail dystrophy: Secondary | ICD-10-CM | POA: Diagnosis not present

## 2018-12-19 DIAGNOSIS — L929 Granulomatous disorder of the skin and subcutaneous tissue, unspecified: Secondary | ICD-10-CM

## 2018-12-19 DIAGNOSIS — M79671 Pain in right foot: Secondary | ICD-10-CM

## 2018-12-19 DIAGNOSIS — B351 Tinea unguium: Secondary | ICD-10-CM | POA: Diagnosis not present

## 2018-12-19 MED ORDER — NEOMYCIN-POLYMYXIN-HC 3.5-10000-1 OT SOLN: OTIC | 0 refills | Status: DC

## 2018-12-19 NOTE — Progress Notes (Signed)
Subjective: Denise Roth is a 48 y.o. female patient seen today in office with complaint of mildly painful thickened and discolored nails and continued pain at right fifth toenail desiring to have the toenail removed and the remaining nails sampled. Patient is desiring treatment for nail changes; has tried OTC topicals/Medication in the past with no improvement. Reports that nails are becoming difficult to manage because of the thickness as discussed at previous visit. Patient has no other pedal complaints at this time.   Denies any changes with medical history since last encounter.  Patient Active Problem List   Diagnosis Date Noted  . Anal fissure s/p LLsphincterotomy 01/04/2012 11/22/2011  . Dyspepsia 11/22/2011  . Anemia 11/22/2011    Current Outpatient Medications on File Prior to Visit  Medication Sig Dispense Refill  . hydrochlorothiazide (HYDRODIURIL) 25 MG tablet Take 25 mg by mouth daily.    . Multiple Vitamin (MULTIVITAMIN) tablet Take 1 tablet by mouth daily.     Current Facility-Administered Medications on File Prior to Visit  Medication Dose Route Frequency Provider Last Rate Last Dose  . 0.9 %  sodium chloride infusion  500 mL Intravenous Continuous Danis, Starr Lake III, MD        No Known Allergies  Objective: Physical Exam  General: Well developed, nourished, no acute distress, awake, alert and oriented x 3  Vascular: Dorsalis pedis artery 2/4 bilateral, Posterior tibial artery 2/4 bilateral, skin temperature warm to warm proximal to distal bilateral lower extremities, no varicosities, pedal hair present bilateral.  Neurological: Gross sensation present via light touch bilateral.   Dermatological: Skin is warm, dry, and supple bilateral, Nails 1-10 are tender, short thick, and discolored with mild subungal debris, no webspace macerations present bilateral, at right fifth toenails there is significant granuloma with a hole and the proximal nail fold of the right fifth  toe with significant nail deformity, no open lesions present bilateral, no callus/corns/hyperkeratotic tissue present bilateral. No signs of infection bilateral.  Musculoskeletal: Pain to palpation right fifth toe.  No other symptomatic boney deformities noted bilateral. Muscular strength within normal limits without painon range of motion. No pain with calf compression bilateral.  Assessment and Plan:  Problem List Items Addressed This Visit    None    Visit Diagnoses    Nail fungus    -  Primary   Relevant Orders   Culture, fungus without smear   Nail dystrophy       Granuloma of skin       Right foot pain          -Examined patient -Discussed treatment options for painful dystrophic nails 1 through 5 on left and 1 through 4 on right and splitting granuloma at right fifth toenail with dystrophy -Discussed treatment alternatives and plan of care; Explained permanent/temporary nail avulsion and post procedure course to patient.  Patient elects for PNA right fifth toenail - After a verbal consent, injected 3 ml of a 50:50 mixture of 2% plain  lidocaine and 0.5% plain marcaine in a normal digital block fashion. Next, a  betadine prep was performed. Anesthesia was tested and found to be appropriate.  The offending right fifth toenail was then incised from the hyponychium to the epinychium. The offending nail border was removed and cleared from the field. The area was curretted for any remaining nail or spicules. Phenol application performed and the area was then flushed with alcohol and dressed with antibiotic cream and a dry sterile dressing. -Patient was instructed to leave  the dressing intact for today and begin soaking  in a weak solution of betadine and water tomorrow. Patient was instructed to  soak for 15 minutes each day and apply neosporin and a gauze or bandaid dressing each day. -Patient was instructed to monitor the toe for signs of infection and return to office if toe becomes  red, hot or swollen -Fungal culture was obtained by removing a portion of the hard nail itself from each of the involved left first through fifth and right first through fourth toenails using a sterile nail nipper and sent to [facility] lab. Patient tolerated the biopsy procedure well without discomfort or need for anesthesia.  -Patient to return in 3-4 weeks for follow up evaluation and discussion of fungal culture results or sooner if symptoms worsen.  Landis Martins, DPM

## 2018-12-19 NOTE — Patient Instructions (Signed)

## 2019-01-09 ENCOUNTER — Other Ambulatory Visit: Payer: Self-pay

## 2019-01-09 ENCOUNTER — Ambulatory Visit: Payer: 59 | Admitting: Sports Medicine

## 2019-01-09 ENCOUNTER — Other Ambulatory Visit: Payer: Self-pay | Admitting: Sports Medicine

## 2019-01-09 ENCOUNTER — Encounter: Payer: Self-pay | Admitting: Sports Medicine

## 2019-01-09 DIAGNOSIS — Z9889 Other specified postprocedural states: Secondary | ICD-10-CM

## 2019-01-09 DIAGNOSIS — B351 Tinea unguium: Secondary | ICD-10-CM | POA: Diagnosis not present

## 2019-01-09 DIAGNOSIS — M79671 Pain in right foot: Secondary | ICD-10-CM

## 2019-01-09 DIAGNOSIS — M779 Enthesopathy, unspecified: Secondary | ICD-10-CM

## 2019-01-09 NOTE — Progress Notes (Signed)
Subjective: Denise Roth is a 48 y.o. female patient seen today in office for fungal culture results.  Patient is also status post right fifth toe permanent nail avulsion procedure performed on 12/19/2018.  Patient reports the area is healing well but did have an episode where she bumped the toe and started to bleed again has been soaking daily with Epson salt and warm water with no issues and applying antibiotic cream.  Patient has no other pedal complaints at this time.   Patient Active Problem List   Diagnosis Date Noted  . Anal fissure s/p LLsphincterotomy 01/04/2012 11/22/2011  . Dyspepsia 11/22/2011  . Anemia 11/22/2011    Current Outpatient Medications on File Prior to Visit  Medication Sig Dispense Refill  . hydrochlorothiazide (HYDRODIURIL) 25 MG tablet Take 25 mg by mouth daily.    . influenza vac split quadrivalent PF (FLUZONE QUADRIVALENT) 0.5 ML injection Fluzone Quad 2019-2020 (PF) 60 mcg (15 mcg x 4)/0.5 mL IM syringe  PHARMACIST ADMINISTERED IMMUNIZATION ADMINISTERED AT TIME OF DISPENSING    . Multiple Vitamin (MULTIVITAMIN) tablet Take 1 tablet by mouth daily.    Marland Kitchen neomycin-polymyxin-hydrocortisone (CORTISPORIN) OTIC solution Apply 1-2 drops to toe after soaking twice a day 10 mL 0   Current Facility-Administered Medications on File Prior to Visit  Medication Dose Route Frequency Provider Last Rate Last Dose  . 0.9 %  sodium chloride infusion  500 mL Intravenous Continuous Danis, Estill Cotta III, MD        No Known Allergies  Objective: Physical Exam  General: Well developed, nourished, no acute distress, awake, alert and oriented x 3  Vascular: Dorsalis pedis artery 2/4 bilateral, Posterior tibial artery 2/4 bilateral, skin temperature warm to warm proximal to distal bilateral lower extremities, no varicosities, pedal hair present bilateral.  Neurological: Gross sensation present via light touch bilateral.   Dermatological: Skin is warm, dry, and supple bilateral,  Nails 1-5 on left 1-4 on right are tender, short thick, and discolored with mild subungal debris, no webspace macerations present bilateral, no open lesions present bilateral, procedure site healing well at right fifth toe with a granular base and mild dry blood surrounding.  No callus/corns/hyperkeratotic tissue present bilateral. No signs of infection bilateral.  Musculoskeletal:Hammertoe boney deformities noted bilateral. Muscular strength within normal limits without painon range of motion. No pain with calf compression bilateral.  Fungal culture + T Rubrum on microscopic exam   Assessment and Plan:  Problem List Items Addressed This Visit    None    Visit Diagnoses    Nail fungus    -  Primary   Relevant Medications   influenza vac split quadrivalent PF (FLUZONE QUADRIVALENT) 0.5 ML injection   S/P nail surgery       Right foot pain         -Examined patient -Discussed treatment options for painful mycotic nails -Patient opt to hold off on any treatment at this time and will await to see how her right fifth toe heals I advised patient to closely monitor if there are changes noted with the other toenails that we need to treat should consider oral Lamisil versus laser versus topical -Patient to return as needed or sooner if symptoms worsen.  Landis Martins, DPM

## 2019-01-23 ENCOUNTER — Encounter: Payer: Self-pay | Admitting: *Deleted

## 2019-01-23 ENCOUNTER — Other Ambulatory Visit: Payer: Self-pay

## 2019-01-23 ENCOUNTER — Ambulatory Visit
Admission: EM | Admit: 2019-01-23 | Discharge: 2019-01-23 | Disposition: A | Discharge status: Home / Self Care | Payer: 59 | Attending: Emergency Medicine | Admitting: Emergency Medicine

## 2019-01-23 DIAGNOSIS — M25561 Pain in right knee: Secondary | ICD-10-CM

## 2019-01-23 DIAGNOSIS — R05 Cough: Secondary | ICD-10-CM | POA: Diagnosis not present

## 2019-01-23 DIAGNOSIS — R059 Cough, unspecified: Secondary | ICD-10-CM

## 2019-01-23 NOTE — ED Triage Notes (Signed)
Patient in office today for right knee pain x63mo

## 2019-01-23 NOTE — ED Provider Notes (Signed)
[facility]    CSN: [id number] Arrival date & time: 01/23/19  1149      History   Chief Complaint Chief Complaint  Patient presents with  . Knee Pain  . Cough    HPI Denise Roth is a 48 y.o. female.   Patient presents with right knee pain and swelling x1 month.  She states the symptoms are intermittent and not currently present.  She denies falls or injury.  She denies numbness, weakness, paresthesias.  Patient also reports rhinorrhea, nasal congestion, nonproductive cough x1 week.  She request a COVID test.  She denies fever, chills, sore throat, shortness of breath, vomiting, diarrhea, rash, or other symptoms.  She has treated her symptoms at home with ibuprofen with moderate relief.  The history is provided by the patient.    Past Medical History:  Diagnosis Date  . Anal fissure   . Anemia   . History of gallstones   . Hypertension   . Obesity   . PCOS (polycystic ovarian syndrome)   . Perianal abscess   . PONV (postoperative nausea and vomiting)     Patient Active Problem List   Diagnosis Date Noted  . Anal fissure s/p LLsphincterotomy 01/04/2012 11/22/2011  . Dyspepsia 11/22/2011  . Anemia 11/22/2011    Past Surgical History:  Procedure Laterality Date  . ANAL EXAMINATION UNDER ANESTHESIA  01/04/12   sphincterotomy by Dr. Michaell CowingGross  . BREAST REDUCTION SURGERY    . CARPAL TUNNEL RELEASE     bilateral  . CHOLECYSTECTOMY    . EAR CYST EXCISION     left  . FLEXIBLE SIGMOIDOSCOPY  12/01/2011   Procedure: FLEXIBLE SIGMOIDOSCOPY;  Surgeon: Louis Meckelobert D Kaplan, MD;  Location: [facility];  Service: Endoscopy;  Laterality: N/A;  . GASTRIC BYPASS    . NASAL SEPTUM SURGERY    . TONSILLECTOMY    . TUBAL LIGATION      OB History   No obstetric history on file.      Home Medications    Prior to Admission medications   Medication Sig Start Date End Date Taking? Authorizing Provider  hydrochlorothiazide (HYDRODIURIL) 25 MG tablet Take 25 mg by mouth  daily.   Yes [provider]  influenza vac split quadrivalent PF (FLUZONE QUADRIVALENT) 0.5 ML injection Fluzone Quad 2019-2020 (PF) 60 mcg (15 mcg x 4)/0.5 mL IM syringe  PHARMACIST ADMINISTERED IMMUNIZATION ADMINISTERED AT TIME OF DISPENSING    [provider]  Multiple Vitamin (MULTIVITAMIN) tablet Take 1 tablet by mouth daily.    [provider]  neomycin-polymyxin-hydrocortisone (CORTISPORIN) OTIC solution Apply 1-2 drops to toe after soaking twice a day 12/19/18   Asencion IslamStover, Titorya, DPM    Family History Family History  Problem Relation Age of Onset  . Breast cancer Paternal Aunt   . Colon polyps Maternal Grandfather   . Diabetes Father   . Hypertension Father   . Heart disease Father   . Hypertension Mother   . Colon cancer Neg Hx     Social History Social History   Tobacco Use  . Smoking status: Never Smoker  . Smokeless tobacco: Never Used  Substance Use Topics  . Alcohol use: Yes    Comment: holidays  . Drug use: No     Allergies   Patient has no known allergies.   Review of Systems Review of Systems  Constitutional: Negative for chills and fever.  HENT: Positive for congestion and rhinorrhea. Negative for ear pain and sore throat.   Eyes:  Negative for pain and visual disturbance.  Respiratory: Positive for cough. Negative for shortness of breath.   Cardiovascular: Negative for chest pain and palpitations.  Gastrointestinal: Negative for abdominal pain, diarrhea, nausea and vomiting.  Genitourinary: Negative for dysuria and hematuria.  Musculoskeletal: Positive for arthralgias. Negative for back pain.  Skin: Negative for color change and rash.  Neurological: Negative for seizures and syncope.  All other systems reviewed and are negative.    Physical Exam Triage Vital Signs ED Triage Vitals [01/23/19 1150]  Enc Vitals Group     BP      Pulse      Resp      Temp      Temp src      SpO2      Weight      Height      Head  Circumference      Peak Flow      Pain Score 0     Pain Loc      Pain Edu?      Excl. in [place]?    No data found.  Updated Vital Signs BP 127/82 (BP Location: Right Arm)   Pulse 66   Temp 98 F (36.7 C) (Oral)   Resp 16   SpO2 99%   Visual Acuity Right Eye Distance:   Left Eye Distance:   Bilateral Distance:    Right Eye Near:   Left Eye Near:    Bilateral Near:     Physical Exam Vitals signs and nursing note reviewed.  Constitutional:      General: She is not in acute distress.    Appearance: She is well-developed. She is not ill-appearing.  HENT:     Head: Normocephalic and atraumatic.     Right Ear: Tympanic membrane normal.     Left Ear: Tympanic membrane normal.     Nose: Nose normal. No congestion or rhinorrhea.     Mouth/Throat:     Mouth: Mucous membranes are moist.     Pharynx: Oropharynx is clear.  Eyes:     Conjunctiva/sclera: Conjunctivae normal.  Neck:     Musculoskeletal: Neck supple.  Cardiovascular:     Rate and Rhythm: Normal rate and regular rhythm.     Heart sounds: No murmur.  Pulmonary:     Effort: Pulmonary effort is normal. No respiratory distress.     Breath sounds: Normal breath sounds.  Abdominal:     General: Bowel sounds are normal. There is no distension.     Palpations: Abdomen is soft.     Tenderness: There is no abdominal tenderness. There is no guarding or rebound.  Musculoskeletal: Normal range of motion.        General: No swelling, tenderness, deformity or signs of injury.  Skin:    General: Skin is warm and dry.     Capillary Refill: Capillary refill takes less than 2 seconds.     Findings: No bruising, erythema, lesion or rash.  Neurological:     General: No focal deficit present.     Mental Status: She is alert and oriented to person, place, and time.     Sensory: No sensory deficit.     Motor: No weakness.     Gait: Gait normal.      UC Treatments / Results  Labs (all labs ordered are listed, but only abnormal  results are displayed) Labs Reviewed  NOVEL CORONAVIRUS, NAA    EKG   Radiology No results found.  Procedures Procedures (including  critical care time)  Medications Ordered in UC Medications - No data to display  Initial Impression / Assessment and Plan / UC Course  I have reviewed the triage vital signs and the nursing notes.  Pertinent labs & imaging results that were available during my care of the patient were reviewed by me and considered in my medical decision making (see chart for details).   Acute right knee pain.  Cough.  Instructed patient to rest and elevate her knee.  Instructed her to apply ice packs 2-3 times each day.  Instructed her to follow-up with orthopedics if her symptoms persist or worsen.  COVID test performed here.  Instructed patient to self quarantine until the test result is back.  Instructed patient to go to the emergency department if she develops Licea fever, shortness of breath, severe diarrhea, or other concerning symptoms.  Patient agrees with plan of care.   Final Clinical Impressions(s) / UC Diagnoses   Final diagnoses:  Acute pain of right knee  Cough     Discharge Instructions     Rest and elevate your knee.  Apply ice packs 2-3 times a day for up to 20 minutes each.      Follow up with your primary care provider or an orthopedist if you symptoms continue or worsen;  Or if you develop new symptoms, such as numbness, tingling, or weakness.    Your COVID test is pending.  You should self quarantine until your test result is back and is negative.    Go to the emergency department if you develop Lieske fever, shortness of breath, severe diarrhea, or other concerning symptoms.        ED Prescriptions    None     PDMP not reviewed this encounter.   Mickie Bail, NP 01/23/19 1233

## 2019-01-23 NOTE — ED Notes (Addendum)
Patient reports shortness, coughing and nasal drainage. Started last Monday. No fevers. Would like to be testing for COVID. Also reports headache.  Patient also reports right knee pain with swelling x 1 month,, no injury. Pain is worse at the end of the day and pain increases with prolonged movement. Patient has been using ace wrap and Voltaren cream with no relief.

## 2019-01-23 NOTE — Discharge Instructions (Addendum)
Rest and elevate your knee.  Apply ice packs 2-3 times a day for up to 20 minutes each.      Follow up with your primary care provider or an orthopedist if you symptoms continue or worsen;  Or if you develop new symptoms, such as numbness, tingling, or weakness.    Your COVID test is pending.  You should self quarantine until your test result is back and is negative.    Go to the emergency department if you develop Cafiero fever, shortness of breath, severe diarrhea, or other concerning symptoms.

## 2019-01-25 LAB — NOVEL CORONAVIRUS, NAA: SARS-CoV-2, NAA: NOT DETECTED

## 2019-03-30 LAB — HM MAMMOGRAPHY

## 2019-04-09 LAB — HM PAP SMEAR: HM Pap smear: NEGATIVE

## 2019-04-11 ENCOUNTER — Telehealth: Payer: Self-pay | Admitting: Hematology and Oncology

## 2019-04-11 NOTE — Telephone Encounter (Signed)
Received a new hem referral from Dr. Cherly Hensen for Denise Roth serum ferritin. Ms. Kueker has been cld and scheduled to see Dr. Leonides Schanz on 2/12 at 9am. She's been made aware to arrive 15 minutes early.

## 2019-04-17 NOTE — Progress Notes (Signed)
[place] [phone]   Fax:([phone] [place] NOTE  Patient Care Team: Everardo Beals, NP as PCP - General Inda Castle, MD (Inactive) as Consulting Physician (Gastroenterology)  Hematological/Oncological History # Hyperferritinemia, chronic 1) 07/26/16: ferritin 4.1, serum iron 11, sat 1.9%, transferrin 406. WBC 7.9, Hgb 9.4, MCV 71.6, Plt 413.  2) 01/24/2017: ferritin 932.9, WBC 14.1, Hgb 12.0, MCV 92.4, Plt 266 3) 04/02/2019: ferritin 774.  4) 04/20/2019: establish care with Dr. Lorenso Courier   CHIEF COMPLAINTS/PURPOSE OF CONSULTATION:  "Hyperferritinemia "  HISTORY OF PRESENTING ILLNESS:  Denise Roth 49 y.o. female with medical history significant for obesity, HTN, PCOS, and anal fissure who presents for evaluation of chronic hyperferritinemia.   On review of the previous records Denise Roth was noted to have a serum ferritin of 4.1 iron sat of 1.9 and serum iron of 11 on 07/17/2016.  Subsequently in November 2018 the patient was found to have an elevated ferritin of 932.9.  She was subsequently seen to have  On exam today Denise Roth notes that she feels well.  She reports that she is concerned about some symptoms she has been having lately including hair loss, shortness of breath, and shoulder pain.  She reports that her hair has been "falling out everywhere" and that she has begun taking some natural supplementations to try to slow this process down.  Additionally she notes that she has become more short of breath on exertion as of late.  She reports that she does have some chronic joint pain and swelling in her ankle joints.  She notes that she drinks only occasionally.  On further discussion she does have history of gastric bypass surgery in 2012.  She reports that she has no travel outside the country and has no history of liver disease.  Her family history is remarkable for rheumatoid arthritis in her maternal grandmother but no other  relevant family history.  She was noted to be iron deficient in May 2018 and did receive IV iron therapy reportedly from her rheumatologist at that time.  Otherwise she denies any fevers, chills, sweats, nausea, vomiting or diarrhea.  A full 10 point ROS is listed below.  MEDICAL HISTORY:  Past Medical History:  Diagnosis Date  . Anal fissure   . Anemia   . History of gallstones   . Hypertension   . Obesity   . PCOS (polycystic ovarian syndrome)   . Perianal abscess   . PONV (postoperative nausea and vomiting)     SURGICAL HISTORY: Past Surgical History:  Procedure Laterality Date  . ANAL EXAMINATION UNDER ANESTHESIA  01/04/12   sphincterotomy by Dr. Johney Maine  . BREAST REDUCTION SURGERY    . CARPAL TUNNEL RELEASE     bilateral  . CHOLECYSTECTOMY    . EAR CYST EXCISION     left  . FLEXIBLE SIGMOIDOSCOPY  12/01/2011   Procedure: FLEXIBLE SIGMOIDOSCOPY;  Surgeon: Inda Castle, MD;  Location: [facility];  Service: Endoscopy;  Laterality: N/A;  . GASTRIC BYPASS    . NASAL SEPTUM SURGERY    . TONSILLECTOMY    . TUBAL LIGATION      SOCIAL HISTORY: Social History   Socioeconomic History  . Marital status: Married    Spouse name: Not on file  . Number of children: 2  . Years of education: Not on file  . Highest education level: Not on file  Occupational History  . Occupation: IT sales professional: UNEMPLOYED  Tobacco  Use  . Smoking status: Never Smoker  . Smokeless tobacco: Never Used  Substance and Sexual Activity  . Alcohol use: Yes    Comment: holidays  . Drug use: No  . Sexual activity: Not on file  Other Topics Concern  . Not on file  Social History Narrative  . Not on file   Social Determinants of Health   Financial Resource Strain:   . Difficulty of Paying Living Expenses: Not on file  Food Insecurity:   . Worried About Charity fundraiser in the Last Year: Not on file  . Ran Out of Food in the Last Year: Not on file  Transportation  Needs:   . Lack of Transportation (Medical): Not on file  . Lack of Transportation (Non-Medical): Not on file  Physical Activity:   . Days of Exercise per Week: Not on file  . Minutes of Exercise per Session: Not on file  Stress:   . Feeling of Stress : Not on file  Social Connections:   . Frequency of Communication with Friends and Family: Not on file  . Frequency of Social Gatherings with Friends and Family: Not on file  . Attends Religious Services: Not on file  . Active Member of Clubs or Organizations: Not on file  . Attends Archivist Meetings: Not on file  . Marital Status: Not on file  Intimate Partner Violence:   . Fear of Current or Ex-Partner: Not on file  . Emotionally Abused: Not on file  . Physically Abused: Not on file  . Sexually Abused: Not on file    FAMILY HISTORY: Family History  Problem Relation Age of Onset  . Breast cancer Paternal Aunt   . Colon polyps Maternal Grandfather   . Diabetes Father   . Hypertension Father   . Heart disease Father   . Hypertension Mother   . Colon cancer Neg Hx     ALLERGIES:  has No Known Allergies.  MEDICATIONS:  Current Outpatient Medications  Medication Sig Dispense Refill  . Cholecalciferol (VITAMIN D3) 125 MCG (5000 UT) CAPS Take by mouth daily.    . COLLAGEN PO Take by mouth daily.    Marland Kitchen zinc sulfate 220 (50 Zn) MG capsule Take 220 mg by mouth daily.    . hydrochlorothiazide (HYDRODIURIL) 25 MG tablet Take 25 mg by mouth daily.    . Multiple Vitamin (MULTIVITAMIN) tablet Take 1 tablet by mouth daily.     Current Facility-Administered Medications  Medication Dose Route Frequency Provider Last Rate Last Admin  . 0.9 %  sodium chloride infusion  500 mL Intravenous Continuous Danis, Estill Cotta III, MD        REVIEW OF SYSTEMS:   Constitutional: ( - ) fevers, ( - )  chills , ( - ) night sweats Eyes: ( - ) blurriness of vision, ( - ) double vision, ( - ) watery eyes Ears, nose, mouth, throat, and face: ( -  ) mucositis, ( - ) sore throat Respiratory: ( - ) cough, ( - ) dyspnea, ( - ) wheezes Cardiovascular: ( - ) palpitation, ( - ) chest discomfort, ( - ) lower extremity swelling Gastrointestinal:  ( - ) nausea, ( - ) heartburn, ( - ) change in bowel habits Skin: ( - ) abnormal skin rashes Lymphatics: ( - ) new lymphadenopathy, ( - ) easy bruising Neurological: ( - ) numbness, ( - ) tingling, ( - ) new weaknesses Behavioral/Psych: ( - ) mood change, ( - )  new changes  All other systems were reviewed with the patient and are negative.  PHYSICAL EXAMINATION: ECOG PERFORMANCE STATUS: 1 - Symptomatic but completely ambulatory  Vitals:   04/20/19 0910  BP: 111/69  Pulse: 71  Resp: 20  Temp: 97.8 F (36.6 C)  SpO2: 99%   Filed Weights   04/20/19 0910  Weight: 172 lb 12.8 oz (78.4 kg)    GENERAL: well appearing middle aged [place] American female in NAD  SKIN: skin color, texture, turgor are normal, no rashes or significant lesions EYES: conjunctiva are pink and non-injected, sclera clear LUNGS: clear to auscultation and percussion with normal breathing effort HEART: regular rate & rhythm and no murmurs and no lower extremity edema ABDOMEN: soft, non-tender, non-distended, normal bowel sounds Musculoskeletal: no cyanosis of digits and no clubbing. Focal fluid palpable over the lateral malleolus PSYCH: alert & oriented x 3, fluent speech NEURO: no focal motor/sensory deficits  LABORATORY DATA:  I have reviewed the data as listed CBC Latest Ref Rng & Units 01/24/2017 07/26/2016 09/09/2011  WBC 4.0 - 10.5 K/uL 14.1(H) 7.9 -  Hemoglobin 12.0 - 15.0 g/dL 12.0 9.4(L) 10.9(L)  Hematocrit 36.0 - 46.0 % 37.5 30.6(L) 32.0(L)  Platelets 150.0 - 400.0 K/uL 266.0 413.0(H) -    CMP Latest Ref Rng & Units 09/09/2011 08/13/2009 07/26/2008  Glucose 70 - 99 mg/dL 81 94 102(H)  BUN 6 - 23 mg/dL '8 10 7  ' Creatinine 0.50 - 1.10 mg/dL 0.70 0.71 0.65  Sodium 135 - 145 mEq/L 141 135 140  Potassium 3.5 - 5.1  mEq/L 3.3(L) 3.7 3.6  Chloride 96 - 112 mEq/L 104 98 104  CO2 19 - 32 mEq/L - 30 31  Calcium 8.4 - 10.5 mg/dL - 9.8 9.2     PATHOLOGY: None relevant to review.   RADIOGRAPHIC STUDIES: None relevant to review.  No results found.  ASSESSMENT & PLAN Denise Roth 49 y.o. female with medical history significant for obesity, HTN, PCOS, and anal fissure who presents for evaluation of chronic hyperferritinemia.  After review the blood work and discussion with the patient the etiology of her elevated ferritin is not clear, however I have a strong suspicion that this is secondary to inflammation from a rheumatological condition.  The patient does have other symptoms that are concerning for inflammation including shoulder pain, marked swelling of the ankle joint, and hair loss.  Patient is taking numerous nutritional supplementations, however I do not believe that these are currently related to her current elevations in ferritin.    An additional possibility would be that the patient has some form of liver inflammation or damage we will assess that with hepatitis B and C serologies as well as a CMP and coag studies.  In the event that all of the below work-up results negative it is possible the patient has a genuine iron overload, though it is unclear how this would have occurred with the iron supplementation that she received for anemia in 2018.  We currently do not have follow-up scheduled with the patient as I have a low suspicion that this is secondary to genuine iron overload, however in the event that we cannot find a clear source we will have the patient return and consider phlebotomy to lower this level.  # Hyperferritinemia, chronic --today will order CMP, CBC, full iron profile --will order Hep B serologies, LDH, Hep C antibody, PT/INR and PTT to assess liver status --will collect ESR and CRP for inflammatory markers --strong suspicion for rheumatological disorder  with swelling of the ankle  joints bilaterally. If inflammatory markers are elevated will refer to rheumatology --RTC PRN if workup does not reveal a clear etiology  #Ankle Swelling Bilaterally --patient has been trying to establish with rheumatology for some time due to shoulder pain and ankle swelling --will refer to rheumatology pending the results of the above studies.  Orders Placed This Encounter  Procedures  . CBC with Differential (Cancer Center Only)    Standing Status:   Future    Number of Occurrences:   1    Standing Expiration Date:   04/19/2020  . Retic Panel    Standing Status:   Future    Number of Occurrences:   1    Standing Expiration Date:   04/19/2020  . Sedimentation rate    Standing Status:   Future    Number of Occurrences:   1    Standing Expiration Date:   04/19/2020  . CMP ([place] only)    Standing Status:   Future    Number of Occurrences:   1    Standing Expiration Date:   04/19/2020  . Lactate dehydrogenase (LDH)    Standing Status:   Future    Number of Occurrences:   1    Standing Expiration Date:   04/19/2020  . Iron and TIBC    Standing Status:   Future    Number of Occurrences:   1    Standing Expiration Date:   04/19/2020  . Ferritin    Standing Status:   Future    Number of Occurrences:   1    Standing Expiration Date:   04/19/2020  . Protime-INR    Standing Status:   Future    Number of Occurrences:   1    Standing Expiration Date:   04/19/2020  . APTT    Standing Status:   Future    Number of Occurrences:   1    Standing Expiration Date:   04/19/2020  . C-reactive protein    Standing Status:   Future    Number of Occurrences:   1    Standing Expiration Date:   04/19/2020  . Hepatitis C antibody    Standing Status:   Future    Number of Occurrences:   1    Standing Expiration Date:   04/19/2020  . Hepatitis B core antibody, total    Standing Status:   Future    Number of Occurrences:   1    Standing Expiration Date:   04/19/2020  . Hepatitis B surface  antibody    Standing Status:   Future    Number of Occurrences:   1    Standing Expiration Date:   04/19/2020  . Hepatitis B surface antigen    Standing Status:   Future    Number of Occurrences:   1    Standing Expiration Date:   04/19/2020   All questions were answered. The patient knows to call the clinic with any problems, questions or concerns.  A total of more than 45 minutes were spent on this encounter and over half of that time was spent on counseling and coordination of care as outlined above.   Ledell Peoples, MD Department of Hematology/Oncology [place] [facility] Phone: [phone] Pager: [phone] Email: [email or web address]  04/20/2019 10:04 AM

## 2019-04-20 ENCOUNTER — Encounter: Payer: Self-pay | Admitting: Hematology and Oncology

## 2019-04-20 ENCOUNTER — Other Ambulatory Visit: Payer: Self-pay

## 2019-04-20 ENCOUNTER — Inpatient Hospital Stay (HOSPITAL_BASED_OUTPATIENT_CLINIC_OR_DEPARTMENT_OTHER): Payer: 59 | Admitting: Hematology and Oncology

## 2019-04-20 ENCOUNTER — Inpatient Hospital Stay: Payer: 59 | Attending: Hematology and Oncology

## 2019-04-20 VITALS — BP 111/69 | HR 71 | Temp 97.8°F | Resp 20 | Ht 65.0 in | Wt 172.8 lb

## 2019-04-20 DIAGNOSIS — R7989 Other specified abnormal findings of blood chemistry: Secondary | ICD-10-CM | POA: Insufficient documentation

## 2019-04-20 DIAGNOSIS — Z803 Family history of malignant neoplasm of breast: Secondary | ICD-10-CM

## 2019-04-20 DIAGNOSIS — M7989 Other specified soft tissue disorders: Secondary | ICD-10-CM | POA: Insufficient documentation

## 2019-04-20 DIAGNOSIS — I10 Essential (primary) hypertension: Secondary | ICD-10-CM | POA: Diagnosis not present

## 2019-04-20 LAB — RETIC PANEL
Immature Retic Fract: 5.3 % (ref 2.3–15.9)
RBC.: 4.77 MIL/uL (ref 3.87–5.11)
Retic Count, Absolute: 54.4 10*3/uL (ref 19.0–186.0)
Retic Ct Pct: 1.1 % (ref 0.4–3.1)
Reticulocyte Hemoglobin: 32.6 pg (ref >27.9)

## 2019-04-20 LAB — CBC WITH DIFFERENTIAL (CANCER CENTER ONLY)
Abs Immature Granulocytes: 0.02 10*3/uL (ref 0.00–0.07)
Basophils Absolute: 0.1 10*3/uL (ref 0.0–0.1)
Basophils Relative: 2 %
Eosinophils Absolute: 0.4 10*3/uL (ref 0.0–0.5)
Eosinophils Relative: 6 %
HCT: 42.9 % (ref 36.0–46.0)
Hemoglobin: 13.3 g/dL (ref 12.0–15.0)
Immature Granulocytes: 0 %
Lymphocytes Relative: 25 %
Lymphs Abs: 1.8 10*3/uL (ref 0.7–4.0)
MCH: 27.9 pg (ref 26.0–34.0)
MCHC: 31 g/dL (ref 30.0–36.0)
MCV: 89.9 fL (ref 80.0–100.0)
Monocytes Absolute: 0.7 10*3/uL (ref 0.1–1.0)
Monocytes Relative: 9 %
Neutro Abs: 4 10*3/uL (ref 1.7–7.7)
Neutrophils Relative %: 58 %
Platelet Count: 294 10*3/uL (ref 150–400)
RBC: 4.77 MIL/uL (ref 3.87–5.11)
RDW: 12.9 % (ref 11.5–15.5)
WBC Count: 7 10*3/uL (ref 4.0–10.5)
nRBC: 0 % (ref 0.0–0.2)

## 2019-04-20 LAB — CMP (CANCER CENTER ONLY)
ALT: 29 U/L (ref 0–44)
AST: 22 U/L (ref 15–41)
Albumin: 4.8 g/dL (ref 3.5–5.0)
Alkaline Phosphatase: 101 U/L (ref 38–126)
Anion gap: 10 (ref 5–15)
BUN: 12 mg/dL (ref 6–20)
CO2: 31 mmol/L (ref 22–32)
Calcium: 9.9 mg/dL (ref 8.9–10.3)
Chloride: 101 mmol/L (ref 98–111)
Creatinine: 0.78 mg/dL (ref 0.44–1.00)
GFR, Est AFR Am: >60 mL/min (ref >60)
GFR, Estimated: >60 mL/min (ref >60)
Glucose, Bld: 99 mg/dL (ref 70–99)
Potassium: 3.9 mmol/L (ref 3.5–5.1)
Sodium: 142 mmol/L (ref 135–145)
Total Bilirubin: 0.4 mg/dL (ref 0.3–1.2)
Total Protein: 8.5 g/dL — ABNORMAL HIGH (ref 6.5–8.1)

## 2019-04-20 LAB — LACTATE DEHYDROGENASE: LDH: 232 U/L — ABNORMAL HIGH (ref 98–192)

## 2019-04-20 LAB — HEPATITIS B CORE ANTIBODY, TOTAL: Hep B Core Total Ab: NONREACTIVE

## 2019-04-20 LAB — C-REACTIVE PROTEIN: CRP: 0.5 mg/dL (ref <1.0)

## 2019-04-20 LAB — IRON AND TIBC
Iron: 63 ug/dL (ref 41–142)
Saturation Ratios: 17 % — ABNORMAL LOW (ref 21–57)
TIBC: 370 ug/dL (ref 236–444)
UIBC: 307 ug/dL (ref 120–384)

## 2019-04-20 LAB — PROTIME-INR
INR: 1 (ref 0.8–1.2)
Prothrombin Time: 13.4 seconds (ref 11.4–15.2)

## 2019-04-20 LAB — SEDIMENTATION RATE: Sed Rate: 10 mm/hr (ref 0–22)

## 2019-04-20 LAB — APTT: aPTT: 31 seconds (ref 24–36)

## 2019-04-20 LAB — FERRITIN: Ferritin: 819 ng/mL — ABNORMAL HIGH (ref 11–307)

## 2019-04-20 LAB — HEPATITIS B SURFACE ANTIGEN: Hepatitis B Surface Ag: NONREACTIVE

## 2019-04-20 LAB — HEPATITIS B SURFACE ANTIBODY,QUALITATIVE: Hep B S Ab: NONREACTIVE

## 2019-04-20 LAB — HEPATITIS C ANTIBODY: HCV Ab: NONREACTIVE

## 2019-04-23 ENCOUNTER — Telehealth: Payer: Self-pay | Admitting: Hematology and Oncology

## 2019-04-23 NOTE — Telephone Encounter (Signed)
Per 2/12 los Return if symptoms worsen or fail to improve

## 2019-04-27 ENCOUNTER — Other Ambulatory Visit: Payer: Self-pay | Admitting: Hematology and Oncology

## 2019-04-27 ENCOUNTER — Telehealth: Payer: Self-pay

## 2019-04-27 DIAGNOSIS — R7989 Other specified abnormal findings of blood chemistry: Secondary | ICD-10-CM

## 2019-04-27 NOTE — Telephone Encounter (Signed)
TCT to patient regarding her VM message requesting for the next step after her visit. Per Dr. Leonides Schanz, advised patient that her ankle swelling and pain are most likely inflammatory and that she needs to be seen by a rheumatologist. Informed her that this referral has been made and she is welcomed to give [place] a call back if she does not hear from them with an appointment date by the end of next week. Patient was agreeable with this and verbalized understanding.

## 2019-07-04 NOTE — Progress Notes (Signed)
Office Visit Note  Patient: Denise Roth             Date of Birth: 1970/08/31           MRN: [id number]             PCP: Everardo Beals, NP Referring: Orson Slick, MD Visit Date: 07/09/2019 Occupation: '@GUAROCC' @  Subjective:  Pain in multiple joints  History of Present Illness: Denise Roth is a 49 y.o. female seen in consultation per request of Dr. Lorenso Courier.  According to patient her symptoms started about 3 years ago with gastroesophageal reflux.  At the time she had extensive GI work-up and was found to have iron deficiency anemia.  She was seen by hematologist and had iron infusion for about 4 months.  She states recently she went to her GYN doctor and was found to have elevated ferritin.  She was again referred to hematology where she had extensive work-up and no etiology was established.  She was referred to me as she was complaining of some arthralgias.  She states she has bulging in her ankles for many years.  She also has discomfort in her knee joints and popping for many years which is more prominent in her right knee than the left.  She continues to have some neck and shoulder which she describes in the trapezius area discomfort for many years.  She states the pain is gradually getting worse.  She states she aches all over.  She also has bilateral carpal tunnel syndrome.  She states she has noticed some hair loss lately.  Activities of Daily Living:  Patient reports morning stiffness for 24 hours.   Patient Reports nocturnal pain.  Difficulty dressing/grooming: Denies Difficulty climbing stairs: Denies Difficulty getting out of chair: Denies Difficulty using hands for taps, buttons, cutlery, and/or writing: Denies  Review of Systems  Constitutional: Positive for fatigue. Negative for night sweats, weight gain and weight loss.  HENT: Positive for mouth dryness. Negative for mouth sores, trouble swallowing, trouble swallowing and nose dryness.   Eyes: Positive for  dryness. Negative for pain, redness and visual disturbance.  Respiratory: Negative for cough, shortness of breath and difficulty breathing.   Cardiovascular: Positive for palpitations and swelling in legs/feet. Negative for chest pain, hypertension and irregular heartbeat.  Gastrointestinal: Positive for constipation and diarrhea. Negative for blood in stool.  Endocrine: Negative for cold intolerance, heat intolerance and increased urination.  Genitourinary: Negative for difficulty urinating and vaginal dryness.  Musculoskeletal: Positive for arthralgias, joint pain, joint swelling, morning stiffness and muscle tenderness. Negative for gait problem, myalgias, muscle weakness and myalgias.  Skin: Positive for rash and hair loss. Negative for color change, skin tightness, ulcers and sensitivity to sunlight.  Allergic/Immunologic: Negative for susceptible to infections.  Neurological: Negative for dizziness, numbness, memory loss, night sweats and weakness.  Hematological: Negative for bruising/bleeding tendency and swollen glands.  Psychiatric/Behavioral: Positive for sleep disturbance. Negative for depressed mood. The patient is nervous/anxious.     PMFS History:  Patient Active Problem List   Diagnosis Date Noted  . History of gallstones 07/09/2019  . Vitamin D deficiency 07/09/2019  . Essential hypertension 07/09/2019  . Anal fissure s/p LLsphincterotomy 01/04/2012 11/22/2011  . Dyspepsia 11/22/2011  . Anemia 11/22/2011    Past Medical History:  Diagnosis Date  . Anal fissure   . Anemia   . History of gallstones   . Hypertension   . Obesity   . PCOS (polycystic ovarian syndrome)   .  Perianal abscess   . PONV (postoperative nausea and vomiting)     Family History  Problem Relation Age of Onset  . Breast cancer Paternal Aunt   . Colon polyps Maternal Grandfather   . Diabetes Father   . Hypertension Father   . Heart disease Father   . Hypertension Mother   . Colon cancer Neg  Hx    Past Surgical History:  Procedure Laterality Date  . ANAL EXAMINATION UNDER ANESTHESIA  01/04/12   sphincterotomy by Dr. Johney Maine  . BREAST REDUCTION SURGERY    . CARPAL TUNNEL RELEASE     bilateral  . CHOLECYSTECTOMY    . EAR CYST EXCISION     left  . FLEXIBLE SIGMOIDOSCOPY  12/01/2011   Procedure: FLEXIBLE SIGMOIDOSCOPY;  Surgeon: Inda Castle, MD;  Location: [facility];  Service: Endoscopy;  Laterality: N/A;  . GASTRIC BYPASS    . NASAL SEPTUM SURGERY    . TONSILLECTOMY    . TUBAL LIGATION     Social History   Social History Narrative  . Not on file   Immunization History  Administered Date(s) Administered  . Influenza, Quadrivalent, Recombinant, Inj, Pf 12/09/2018  . Influenza,inj,quad, With Preservative 01/03/2018, 12/09/2018  . Tdap 03/22/2014     Objective: Vital Signs: BP 104/64 (BP Location: Right Arm, Patient Position: Sitting, Cuff Size: Normal)   Pulse 62   Resp 14   Ht '5\' 3"'  (1.6 m)   Wt 169 lb 6.4 oz (76.8 kg)   BMI 30.01 kg/m    Physical Exam Vitals and nursing note reviewed.  Constitutional:      Appearance: She is well-developed.  HENT:     Head: Normocephalic and atraumatic.  Eyes:     Conjunctiva/sclera: Conjunctivae normal.  Cardiovascular:     Rate and Rhythm: Normal rate and regular rhythm.     Heart sounds: Normal heart sounds.  Pulmonary:     Effort: Pulmonary effort is normal.     Breath sounds: Normal breath sounds.  Abdominal:     General: Bowel sounds are normal.     Palpations: Abdomen is soft.  Musculoskeletal:     Cervical back: Normal range of motion.  Lymphadenopathy:     Cervical: No cervical adenopathy.  Skin:    General: Skin is warm and dry.     Capillary Refill: Capillary refill takes less than 2 seconds.  Neurological:     Mental Status: She is alert and oriented to person, place, and time.  Psychiatric:        Behavior: Behavior normal.      Musculoskeletal Exam: She has discomfort with range of  motion of her cervical spine.  She had postsurgical mark on the lumbar region due to previous surgery.  Shoulder joints, elbow joints, wrist joints, MCPs PIPs and DIPs with good range of motion with no synovitis.  Hip joints with good range of motion.  Knee joints with good range of motion with no warmth swelling or effusion.  She has good range of motion of ankle joints without any warmth or effusion.  She has bilateral fat pad over her ankle joints.  CDAI Exam: CDAI Score: -- Patient Global: --; Provider Global: -- Swollen: --; Tender: -- Joint Exam 07/09/2019   No joint exam has been documented for this visit   There is currently no information documented on the homunculus. Go to the Rheumatology activity and complete the homunculus joint exam.  Investigation: No additional findings.  Imaging: XR Ankle 2 Views Left  Result Date: 07/09/2019 No tibiotalar or subtalar joint space narrowing was noted.  Inferior calcaneal spur was noted.  No erosive changes were noted. Impression: Unremarkable x-ray of the ankle joint.  XR Ankle 2 Views Right  Result Date: 07/09/2019 No tibiotalar or subtalar joint space narrowing was noted.  No erosive changes were noted. Impression: Unremarkable x-ray of the ankle joint.  XR Cervical Spine 2 or 3 views  Result Date: 07/09/2019 No significant disc space narrowing was noted.  Mild facet joint arthropathy was noted.  Mild anterior spurring was noted. Impression: These findings are consistent mild spondylosis of the cervical spine.  XR KNEE 3 VIEW LEFT  Result Date: 07/09/2019 Mild medial compartment narrowing was noted.  No patellofemoral narrowing was noted.  No chondrocalcinosis was noted. Impression: These findings are consistent mild osteoarthritis of the knee joint.  XR KNEE 3 VIEW RIGHT  Result Date: 07/09/2019 No medial lateral compartment narrowing was noted.  Mild patellofemoral narrowing was noted.  No chondrocalcinosis was noted. Impression:  These findings are consistent with chondromalacia patella of the knee.   Recent Labs: Lab Results  Component Value Date   WBC 7.0 04/20/2019   HGB 13.3 04/20/2019   PLT 294 04/20/2019   NA 142 04/20/2019   K 3.9 04/20/2019   CL 101 04/20/2019   CO2 31 04/20/2019   GLUCOSE 99 04/20/2019   BUN 12 04/20/2019   CREATININE 0.78 04/20/2019   BILITOT 0.4 04/20/2019   ALKPHOS 101 04/20/2019   AST 22 04/20/2019   ALT 29 04/20/2019   PROT 8.5 (H) 04/20/2019   ALBUMIN 4.8 04/20/2019   CALCIUM 9.9 04/20/2019   GFRAA >60 04/20/2019    Speciality Comments: No specialty comments available.  Procedures:  No procedures performed Allergies: Patient has no known allergies.   Assessment / Plan:     Visit Diagnoses: Swelling of both ankles -patient gives history of puffiness in his ankles.  She appears to have some fat pad.  I do not see any synovitis or swelling.  As she has chronic discomfort I will obtain x-rays.  Plan: XR Ankle 2 Views Right, XR Ankle 2 Views Left  Chronic pain of both knees -she complains of popping and discomfort in her knee joints.  No warmth swelling or effusion was noted.  Plan: XR KNEE 3 VIEW RIGHT, XR KNEE 3 VIEW LEFT, Rheumatoid factor, Cyclic citrul peptide antibody, IgG, 14-3-3 eta Protein, ANA, Uric acid, HLA-B27 antigen  Neck pain -she complains of neck pain and stiffness.  Plan: XR Cervical Spine 2 or 3 views  DDD (degenerative disc disease), lumbar - s/p discectomy  Elevated ferritin -she had extensive work-up by hematology.  04/20/19: Ferritin 819, iron 63, TIBC 370, Sat ratios 17%, UIBC 307, PT 13.4, INR 1, ESR 10, LDH 232, Hep B and C negative, CRP 0.5  Essential hypertension-she is on HCTZ and her blood pressure is normal today.  History of gastroesophageal reflux (GERD)  Vitamin D deficiency-patient is on vitamin D supplement.  History of anemia-she had extensive work-up by hematology.  History of gallstones  PCOS (polycystic ovarian  syndrome)  Anal fissure s/p LLsphincterotomy 01/04/2012  Orders: Orders Placed This Encounter  Procedures  . XR Cervical Spine 2 or 3 views  . XR KNEE 3 VIEW RIGHT  . XR KNEE 3 VIEW LEFT  . XR Ankle 2 Views Right  . XR Ankle 2 Views Left  . Rheumatoid factor  . Cyclic citrul peptide antibody, IgG  . 14-3-3 eta Protein  .  ANA  . Uric acid  . HLA-B27 antigen   No orders of the defined types were placed in this encounter.   Face-to-face time spent with patient was 50 minutes. Greater than 50% of time was spent in counseling and coordination of care.  Follow-Up Instructions: Return for Polyarthralgia.   Bo Merino, MD  Note - This record has been created using Editor, commissioning.  Chart creation errors have been sought, but may not always  have been located. Such creation errors do not reflect on  the standard of medical care.

## 2019-07-09 ENCOUNTER — Ambulatory Visit: Payer: Self-pay

## 2019-07-09 ENCOUNTER — Encounter: Payer: Self-pay | Admitting: Rheumatology

## 2019-07-09 ENCOUNTER — Other Ambulatory Visit: Payer: Self-pay

## 2019-07-09 ENCOUNTER — Ambulatory Visit: Payer: 59 | Admitting: Rheumatology

## 2019-07-09 VITALS — BP 104/64 | HR 62 | Resp 14 | Ht 63.0 in | Wt 169.4 lb

## 2019-07-09 DIAGNOSIS — M25561 Pain in right knee: Secondary | ICD-10-CM

## 2019-07-09 DIAGNOSIS — M25472 Effusion, left ankle: Secondary | ICD-10-CM | POA: Diagnosis not present

## 2019-07-09 DIAGNOSIS — M25562 Pain in left knee: Secondary | ICD-10-CM

## 2019-07-09 DIAGNOSIS — Z862 Personal history of diseases of the blood and blood-forming organs and certain disorders involving the immune mechanism: Secondary | ICD-10-CM

## 2019-07-09 DIAGNOSIS — M542 Cervicalgia: Secondary | ICD-10-CM

## 2019-07-09 DIAGNOSIS — Z8719 Personal history of other diseases of the digestive system: Secondary | ICD-10-CM | POA: Insufficient documentation

## 2019-07-09 DIAGNOSIS — M25471 Effusion, right ankle: Secondary | ICD-10-CM | POA: Diagnosis not present

## 2019-07-09 DIAGNOSIS — G8929 Other chronic pain: Secondary | ICD-10-CM | POA: Diagnosis not present

## 2019-07-09 DIAGNOSIS — E559 Vitamin D deficiency, unspecified: Secondary | ICD-10-CM | POA: Insufficient documentation

## 2019-07-09 DIAGNOSIS — I10 Essential (primary) hypertension: Secondary | ICD-10-CM

## 2019-07-09 DIAGNOSIS — M5136 Other intervertebral disc degeneration, lumbar region: Secondary | ICD-10-CM | POA: Diagnosis not present

## 2019-07-09 DIAGNOSIS — K602 Anal fissure, unspecified: Secondary | ICD-10-CM

## 2019-07-09 DIAGNOSIS — E282 Polycystic ovarian syndrome: Secondary | ICD-10-CM

## 2019-07-09 DIAGNOSIS — R7989 Other specified abnormal findings of blood chemistry: Secondary | ICD-10-CM

## 2019-07-13 LAB — 14-3-3 ETA PROTEIN: 14-3-3 eta Protein: <0.2 ng/mL (ref <0.2)

## 2019-07-13 LAB — URIC ACID: Uric Acid, Serum: 5.7 mg/dL (ref 2.5–7.0)

## 2019-07-13 LAB — RHEUMATOID FACTOR: Rheumatoid fact SerPl-aCnc: <14 IU/mL (ref <14)

## 2019-07-13 LAB — CYCLIC CITRUL PEPTIDE ANTIBODY, IGG: Cyclic Citrullin Peptide Ab: 17 UNITS

## 2019-07-13 LAB — ANA: Anti Nuclear Antibody (ANA): NEGATIVE

## 2019-07-13 LAB — HLA-B27 ANTIGEN: HLA-B27 Antigen: NEGATIVE

## 2019-07-16 NOTE — Progress Notes (Signed)
I will discuss results at the fu visit.

## 2019-07-30 NOTE — Progress Notes (Deleted)
Office Visit Note  Patient: Denise Roth             Date of Birth: 05-01-1970           MRN: [id number]             PCP: Marva Panda, NP Referring: Marva Panda, NP Visit Date: 08/10/2019 Occupation: @GUAROCC @  Subjective:  No chief complaint on file.   History of Present Illness: Denise Roth is a 49 y.o. female ***   Activities of Daily Living:  Patient reports morning stiffness for *** {minute/hour:19697}.   Patient {ACTIONS;DENIES/REPORTS:21021675::"Denies"} nocturnal pain.  Difficulty dressing/grooming: {ACTIONS;DENIES/REPORTS:21021675::"Denies"} Difficulty climbing stairs: {ACTIONS;DENIES/REPORTS:21021675::"Denies"} Difficulty getting out of chair: {ACTIONS;DENIES/REPORTS:21021675::"Denies"} Difficulty using hands for taps, buttons, cutlery, and/or writing: {ACTIONS;DENIES/REPORTS:21021675::"Denies"}  No Rheumatology ROS completed.   PMFS History:  Patient Active Problem List   Diagnosis Date Noted  . History of gallstones 07/09/2019  . Vitamin D deficiency 07/09/2019  . Essential hypertension 07/09/2019  . Anal fissure s/p LLsphincterotomy 01/04/2012 11/22/2011  . Dyspepsia 11/22/2011  . Anemia 11/22/2011    Past Medical History:  Diagnosis Date  . Anal fissure   . Anemia   . History of gallstones   . Hypertension   . Obesity   . PCOS (polycystic ovarian syndrome)   . Perianal abscess   . PONV (postoperative nausea and vomiting)     Family History  Problem Relation Age of Onset  . Breast cancer Paternal Aunt   . Colon polyps Maternal Grandfather   . Diabetes Father   . Hypertension Father   . Heart disease Father   . Hypertension Mother   . Colon cancer Neg Hx    Past Surgical History:  Procedure Laterality Date  . ANAL EXAMINATION UNDER ANESTHESIA  01/04/12   sphincterotomy by Dr. 01/06/12  . BREAST REDUCTION SURGERY    . CARPAL TUNNEL RELEASE     bilateral  . CHOLECYSTECTOMY    . EAR CYST EXCISION     left  . FLEXIBLE  SIGMOIDOSCOPY  12/01/2011   Procedure: FLEXIBLE SIGMOIDOSCOPY;  Surgeon: 12/03/2011, MD;  Location: [facility];  Service: Endoscopy;  Laterality: N/A;  . GASTRIC BYPASS    . NASAL SEPTUM SURGERY    . TONSILLECTOMY    . TUBAL LIGATION     Social History   Social History Narrative  . Not on file   Immunization History  Administered Date(s) Administered  . Influenza, Quadrivalent, Recombinant, Inj, Pf 12/09/2018  . Influenza,inj,quad, With Preservative 01/03/2018, 12/09/2018  . Tdap 03/22/2014     Objective: Vital Signs: There were no vitals taken for this visit.   Physical Exam   Musculoskeletal Exam: ***  CDAI Exam: CDAI Score: -- Patient Global: --; Provider Global: -- Swollen: --; Tender: -- Joint Exam 08/10/2019   No joint exam has been documented for this visit   There is currently no information documented on the homunculus. Go to the Rheumatology activity and complete the homunculus joint exam.  Investigation: No additional findings.  Imaging: XR Ankle 2 Views Left  Result Date: 07/09/2019 No tibiotalar or subtalar joint space narrowing was noted.  Inferior calcaneal spur was noted.  No erosive changes were noted. Impression: Unremarkable x-ray of the ankle joint.  XR Ankle 2 Views Right  Result Date: 07/09/2019 No tibiotalar or subtalar joint space narrowing was noted.  No erosive changes were noted. Impression: Unremarkable x-ray of the ankle joint.  XR Cervical Spine 2 or 3 views  Result Date: 07/09/2019 No significant  disc space narrowing was noted.  Mild facet joint arthropathy was noted.  Mild anterior spurring was noted. Impression: These findings are consistent mild spondylosis of the cervical spine.  XR KNEE 3 VIEW LEFT  Result Date: 07/09/2019 Mild medial compartment narrowing was noted.  No patellofemoral narrowing was noted.  No chondrocalcinosis was noted. Impression: These findings are consistent mild osteoarthritis of the knee joint.  XR  KNEE 3 VIEW RIGHT  Result Date: 07/09/2019 No medial lateral compartment narrowing was noted.  Mild patellofemoral narrowing was noted.  No chondrocalcinosis was noted. Impression: These findings are consistent with chondromalacia patella of the knee.   Recent Labs: Lab Results  Component Value Date   WBC 7.0 04/20/2019   HGB 13.3 04/20/2019   PLT 294 04/20/2019   NA 142 04/20/2019   K 3.9 04/20/2019   CL 101 04/20/2019   CO2 31 04/20/2019   GLUCOSE 99 04/20/2019   BUN 12 04/20/2019   CREATININE 0.78 04/20/2019   BILITOT 0.4 04/20/2019   ALKPHOS 101 04/20/2019   AST 22 04/20/2019   ALT 29 04/20/2019   PROT 8.5 (H) 04/20/2019   ALBUMIN 4.8 04/20/2019   CALCIUM 9.9 04/20/2019   GFRAA >60 04/20/2019    Speciality Comments: No specialty comments available.  Procedures:  No procedures performed Allergies: Patient has no known allergies.   Assessment / Plan:     Visit Diagnoses: No diagnosis found.  Orders: No orders of the defined types were placed in this encounter.  No orders of the defined types were placed in this encounter.   Face-to-face time spent with patient was *** minutes. Greater than 50% of time was spent in counseling and coordination of care.  Follow-Up Instructions: No follow-ups on file.   Bo Merino, MD  Note - This record has been created using Editor, commissioning.  Chart creation errors have been sought, but may not always  have been located. Such creation errors do not reflect on  the standard of medical care.

## 2019-08-10 ENCOUNTER — Ambulatory Visit: Payer: 59 | Admitting: Rheumatology

## 2019-08-17 DIAGNOSIS — M25472 Effusion, left ankle: Secondary | ICD-10-CM | POA: Insufficient documentation

## 2019-08-17 NOTE — Progress Notes (Deleted)
Office Visit Note  Patient: Denise Roth             Date of Birth: January 28, 1971           MRN: [id number]             PCP: Everardo Beals, NP Referring: Everardo Beals, NP Visit Date: 08/23/2019 Occupation: '@GUAROCC' @  Subjective:  No chief complaint on file.   History of Present Illness: Denise Roth is a 49 y.o. female ***   Activities of Daily Living:  Patient reports morning stiffness for *** {minute/hour:19697}.   Patient {ACTIONS;DENIES/REPORTS:21021675::"Denies"} nocturnal pain.  Difficulty dressing/grooming: {ACTIONS;DENIES/REPORTS:21021675::"Denies"} Difficulty climbing stairs: {ACTIONS;DENIES/REPORTS:21021675::"Denies"} Difficulty getting out of chair: {ACTIONS;DENIES/REPORTS:21021675::"Denies"} Difficulty using hands for taps, buttons, cutlery, and/or writing: {ACTIONS;DENIES/REPORTS:21021675::"Denies"}  No Rheumatology ROS completed.   PMFS History:  Patient Active Problem List   Diagnosis Date Noted  . History of gallstones 07/09/2019  . Vitamin D deficiency 07/09/2019  . Essential hypertension 07/09/2019  . Anal fissure s/p LLsphincterotomy 01/04/2012 11/22/2011  . Dyspepsia 11/22/2011  . Anemia 11/22/2011    Past Medical History:  Diagnosis Date  . Anal fissure   . Anemia   . History of gallstones   . Hypertension   . Obesity   . PCOS (polycystic ovarian syndrome)   . Perianal abscess   . PONV (postoperative nausea and vomiting)     Family History  Problem Relation Age of Onset  . Breast cancer Paternal Aunt   . Colon polyps Maternal Grandfather   . Diabetes Father   . Hypertension Father   . Heart disease Father   . Hypertension Mother   . Colon cancer Neg Hx    Past Surgical History:  Procedure Laterality Date  . ANAL EXAMINATION UNDER ANESTHESIA  01/04/12   sphincterotomy by Dr. Johney Maine  . BREAST REDUCTION SURGERY    . CARPAL TUNNEL RELEASE     bilateral  . CHOLECYSTECTOMY    . EAR CYST EXCISION     left  . FLEXIBLE  SIGMOIDOSCOPY  12/01/2011   Procedure: FLEXIBLE SIGMOIDOSCOPY;  Surgeon: Inda Castle, MD;  Location: [facility];  Service: Endoscopy;  Laterality: N/A;  . GASTRIC BYPASS    . NASAL SEPTUM SURGERY    . TONSILLECTOMY    . TUBAL LIGATION     Social History   Social History Narrative  . Not on file   Immunization History  Administered Date(s) Administered  . Influenza, Quadrivalent, Recombinant, Inj, Pf 12/09/2018  . Influenza,inj,quad, With Preservative 01/03/2018, 12/09/2018  . Tdap 03/22/2014     Objective: Vital Signs: There were no vitals taken for this visit.   Physical Exam   Musculoskeletal Exam: ***  CDAI Exam: CDAI Score: -- Patient Global: --; Provider Global: -- Swollen: --; Tender: -- Joint Exam 08/23/2019   No joint exam has been documented for this visit   There is currently no information documented on the homunculus. Go to the Rheumatology activity and complete the homunculus joint exam.  Investigation: No additional findings.  Imaging: No results found.  Recent Labs: Lab Results  Component Value Date   WBC 7.0 04/20/2019   HGB 13.3 04/20/2019   PLT 294 04/20/2019   NA 142 04/20/2019   K 3.9 04/20/2019   CL 101 04/20/2019   CO2 31 04/20/2019   GLUCOSE 99 04/20/2019   BUN 12 04/20/2019   CREATININE 0.78 04/20/2019   BILITOT 0.4 04/20/2019   ALKPHOS 101 04/20/2019   AST 22 04/20/2019   ALT 29 04/20/2019  PROT 8.5 (H) 04/20/2019   ALBUMIN 4.8 04/20/2019   CALCIUM 9.9 04/20/2019   GFRAA >60 04/20/2019  Jul 09, 2018 RF negative, anti-CCP negative, '14 3 3 ' eta negative, ANA negative, uric acid 5.7, HLA-B27 negative  Speciality Comments: No specialty comments available.  Procedures:  No procedures performed Allergies: Patient has no known allergies.   Assessment / Plan:     Visit Diagnoses: No diagnosis found.  Orders: No orders of the defined types were placed in this encounter.  No orders of the defined types were placed in this  encounter.   Face-to-face time spent with patient was *** minutes. Greater than 50% of time was spent in counseling and coordination of care.  Follow-Up Instructions: No follow-ups on file.   Bo Merino, MD  Note - This record has been created using Editor, commissioning.  Chart creation errors have been sought, but may not always  have been located. Such creation errors do not reflect on  the standard of medical care.

## 2019-08-23 ENCOUNTER — Ambulatory Visit: Payer: 59 | Admitting: Rheumatology

## 2019-08-24 NOTE — Progress Notes (Signed)
Office Visit Note  Patient: Denise Roth             Date of Birth: 1970-03-31           MRN: [id number]             PCP: Everardo Beals, NP Referring: Everardo Beals, NP Visit Date: 08/27/2019 Occupation: '@GUAROCC' @  Subjective:  Pain in multiple joints.   History of Present Illness: Denise Roth is a 49 y.o. female with history of pain in multiple joints.  She states she continues to have some discomfort in her knee joints and ankles.  She has not noticed any joint swelling.  She also has a stiffness in her cervical spine.  Lower back pain is off and on.  Activities of Daily Living:  Patient reports morning stiffness for  30 minutes.   Patient Reports nocturnal pain.  Difficulty dressing/grooming: Denies Difficulty climbing stairs: Reports Difficulty getting out of chair: Denies Difficulty using hands for taps, buttons, cutlery, and/or writing: Denies  Review of Systems  Constitutional: Positive for fatigue.  HENT: Negative for mouth sores, mouth dryness and nose dryness.   Eyes: Negative for itching and dryness.  Respiratory: Negative for shortness of breath and difficulty breathing.   Cardiovascular: Negative for chest pain and palpitations.  Gastrointestinal: Negative for blood in stool, constipation and diarrhea.  Endocrine: Negative for increased urination.  Genitourinary: Negative for difficulty urinating.  Musculoskeletal: Positive for arthralgias, joint pain, joint swelling, myalgias, morning stiffness, muscle tenderness and myalgias.  Skin: Positive for rash. Negative for color change.  Allergic/Immunologic: Negative for susceptible to infections.  Neurological: Positive for numbness. Negative for dizziness, headaches, memory loss and weakness.  Hematological: Negative for bruising/bleeding tendency.  Psychiatric/Behavioral: Negative for confusion and sleep disturbance.    PMFS History:  Patient Active Problem List   Diagnosis Date Noted  . Swelling  of both ankles 08/17/2019  . History of gallstones 07/09/2019  . Vitamin D deficiency 07/09/2019  . Essential hypertension 07/09/2019  . Anal fissure s/p LLsphincterotomy 01/04/2012 11/22/2011  . Dyspepsia 11/22/2011  . Anemia 11/22/2011    Past Medical History:  Diagnosis Date  . Anal fissure   . Anemia   . History of gallstones   . Hypertension   . Obesity   . PCOS (polycystic ovarian syndrome)   . Perianal abscess   . PONV (postoperative nausea and vomiting)     Family History  Problem Relation Age of Onset  . Breast cancer Paternal Aunt   . Colon polyps Maternal Grandfather   . Diabetes Father   . Hypertension Father   . Heart disease Father   . Hypertension Mother   . Colon cancer Neg Hx    Past Surgical History:  Procedure Laterality Date  . ANAL EXAMINATION UNDER ANESTHESIA  01/04/12   sphincterotomy by Dr. Johney Maine  . BREAST REDUCTION SURGERY    . CARPAL TUNNEL RELEASE     bilateral  . CHOLECYSTECTOMY    . EAR CYST EXCISION     left  . FLEXIBLE SIGMOIDOSCOPY  12/01/2011   Procedure: FLEXIBLE SIGMOIDOSCOPY;  Surgeon: Inda Castle, MD;  Location: [facility];  Service: Endoscopy;  Laterality: N/A;  . GASTRIC BYPASS    . NASAL SEPTUM SURGERY    . TONSILLECTOMY    . TUBAL LIGATION     Social History   Social History Narrative  . Not on file   Immunization History  Administered Date(s) Administered  . Influenza, Quadrivalent, Recombinant, Inj, Pf 12/09/2018  .  Influenza,inj,quad, With Preservative 01/03/2018, 12/09/2018  . Tdap 03/22/2014     Objective: Vital Signs: BP 113/78 (BP Location: Left Arm, Patient Position: Sitting, Cuff Size: Normal)   Pulse 82   Resp 15   Ht '5\' 3"'  (1.6 m)   Wt 166 lb 6.4 oz (75.5 kg)   BMI 29.48 kg/m    Physical Exam Vitals and nursing note reviewed.  Constitutional:      Appearance: She is well-developed.  HENT:     Head: Normocephalic and atraumatic.  Eyes:     Conjunctiva/sclera: Conjunctivae normal.    Cardiovascular:     Rate and Rhythm: Normal rate and regular rhythm.     Heart sounds: Normal heart sounds.  Pulmonary:     Effort: Pulmonary effort is normal.     Breath sounds: Normal breath sounds.  Abdominal:     General: Bowel sounds are normal.     Palpations: Abdomen is soft.  Musculoskeletal:     Cervical back: Normal range of motion.  Lymphadenopathy:     Cervical: No cervical adenopathy.  Skin:    General: Skin is warm and dry.     Capillary Refill: Capillary refill takes less than 2 seconds.  Neurological:     Mental Status: She is alert and oriented to person, place, and time.  Psychiatric:        Behavior: Behavior normal.      Musculoskeletal Exam: C-spine was in good range of motion.  Shoulder joints, elbow joints, wrist joints with good range of motion.  She had no tenderness on palpation of her wrist joint MCPs PIPs with no synovitis.  Hip joints with good range of motion.  She has good range of motion of bilateral knee joints without any warmth swelling or effusion.  Ankle joints with good range of motion with no synovitis.  CDAI Exam: CDAI Score: -- Patient Global: --; Provider Global: -- Swollen: --; Tender: -- Joint Exam 08/27/2019   No joint exam has been documented for this visit   There is currently no information documented on the homunculus. Go to the Rheumatology activity and complete the homunculus joint exam.  Investigation: No additional findings.  Imaging: No results found.  Recent Labs: Lab Results  Component Value Date   WBC 7.0 04/20/2019   HGB 13.3 04/20/2019   PLT 294 04/20/2019   NA 142 04/20/2019   K 3.9 04/20/2019   CL 101 04/20/2019   CO2 31 04/20/2019   GLUCOSE 99 04/20/2019   BUN 12 04/20/2019   CREATININE 0.78 04/20/2019   BILITOT 0.4 04/20/2019   ALKPHOS 101 04/20/2019   AST 22 04/20/2019   ALT 29 04/20/2019   PROT 8.5 (H) 04/20/2019   ALBUMIN 4.8 04/20/2019   CALCIUM 9.9 04/20/2019   GFRAA >60 04/20/2019    Jul 09, 2019 RF negative, anti-CCP negative, '14 3 3 ' eta negative, ENA negative, uric acid 5.7, HLA-B27 negative  Speciality Comments: No specialty comments available.  Procedures:  No procedures performed Allergies: Patient has no known allergies.   Assessment / Plan:     Visit Diagnoses: Chronic pain of both ankles -patient had no synovitis on examination.  All autoimmune work-up negative.  X-rays are normal.  Ankle joint exercises were demonstrated in the office.  Chronic pain of both knees -she had no warmth or swelling on exam.  X-rays were unremarkable.  I reviewed the x-ray findings with her today.  A handout on knee joint exercises was given.  She has been also walking  on a regular basis.  A list of natural anti-inflammatory was given.  DDD (degenerative disc disease), cervical - Mild spondylosis was noted on the x-ray.  Neck exercises were demonstrated in the office and a handout was given.  DDD (degenerative disc disease), lumbar - S/p discectomy.  She is off-and-on discomfort.  Other medical problems are listed as follows:  Elevated ferritin  Essential hypertension  History of gastroesophageal reflux (GERD)  Vitamin D deficiency  History of anemia  History of gallstones  PCOS (polycystic ovarian syndrome)  Orders: No orders of the defined types were placed in this encounter.  No orders of the defined types were placed in this encounter.   Follow-Up Instructions: Return in about 1 year (around 08/26/2020) for Osteoarthritis.  Have advised her to contact me in case she develops any new symptoms.   Bo Merino, MD  Note - This record has been created using Editor, commissioning.  Chart creation errors have been sought, but may not always  have been located. Such creation errors do not reflect on  the standard of medical care.

## 2019-08-27 ENCOUNTER — Other Ambulatory Visit: Payer: Self-pay

## 2019-08-27 ENCOUNTER — Ambulatory Visit: Payer: 59 | Admitting: Rheumatology

## 2019-08-27 ENCOUNTER — Encounter: Payer: Self-pay | Admitting: Rheumatology

## 2019-08-27 VITALS — BP 113/78 | HR 82 | Resp 15 | Ht 63.0 in | Wt 166.4 lb

## 2019-08-27 DIAGNOSIS — M25571 Pain in right ankle and joints of right foot: Secondary | ICD-10-CM | POA: Diagnosis not present

## 2019-08-27 DIAGNOSIS — E559 Vitamin D deficiency, unspecified: Secondary | ICD-10-CM

## 2019-08-27 DIAGNOSIS — M25561 Pain in right knee: Secondary | ICD-10-CM | POA: Diagnosis not present

## 2019-08-27 DIAGNOSIS — M25562 Pain in left knee: Secondary | ICD-10-CM

## 2019-08-27 DIAGNOSIS — M5030 Other cervical disc degeneration, unspecified cervical region: Secondary | ICD-10-CM

## 2019-08-27 DIAGNOSIS — M5136 Other intervertebral disc degeneration, lumbar region: Secondary | ICD-10-CM | POA: Diagnosis not present

## 2019-08-27 DIAGNOSIS — M25572 Pain in left ankle and joints of left foot: Secondary | ICD-10-CM

## 2019-08-27 DIAGNOSIS — Z8719 Personal history of other diseases of the digestive system: Secondary | ICD-10-CM

## 2019-08-27 DIAGNOSIS — I10 Essential (primary) hypertension: Secondary | ICD-10-CM

## 2019-08-27 DIAGNOSIS — G8929 Other chronic pain: Secondary | ICD-10-CM

## 2019-08-27 DIAGNOSIS — E282 Polycystic ovarian syndrome: Secondary | ICD-10-CM

## 2019-08-27 DIAGNOSIS — Z862 Personal history of diseases of the blood and blood-forming organs and certain disorders involving the immune mechanism: Secondary | ICD-10-CM

## 2019-08-27 DIAGNOSIS — R7989 Other specified abnormal findings of blood chemistry: Secondary | ICD-10-CM

## 2019-08-27 NOTE — Patient Instructions (Addendum)
Journal for Nurse Practitioners, 15(4), 263-267. Retrieved December 12, 2017 from [email or web address].com/[email or web address]">  Knee Exercises Ask your health care provider which exercises are safe for you. Do exercises exactly as told by your health care provider and adjust them as directed. It is normal to feel mild stretching, pulling, tightness, or discomfort as you do these exercises. Stop right away if you feel sudden pain or your pain gets worse. Do not begin these exercises until told by your health care provider. Stretching and range-of-motion exercises These exercises warm up your muscles and joints and improve the movement and flexibility of your knee. These exercises also help to relieve pain and swelling. Knee extension, prone 1. Lie on your abdomen (prone position) on a bed. 2. Place your left / right knee just beyond the edge of the surface so your knee is not on the bed. You can put a towel under your left / right thigh just above your kneecap for comfort. 3. Relax your leg muscles and allow gravity to straighten your knee (extension). You should feel a stretch behind your left / right knee. 4. Hold this position for __________ seconds. 5. Scoot up so your knee is supported between repetitions. Repeat __________ times. Complete this exercise __________ times a day. Knee flexion, active  1. Lie on your back with both legs straight. If this causes back discomfort, bend your left / right knee so your foot is flat on the floor. 2. Slowly slide your left / right heel back toward your buttocks. Stop when you feel a gentle stretch in the front of your knee or thigh (flexion). 3. Hold this position for __________ seconds. 4. Slowly slide your left / right heel back to the starting position. Repeat __________ times. Complete this exercise __________ times a day. Quadriceps stretch, prone  1. Lie on your abdomen on a firm surface, such as a bed or padded floor. 2. Bend your left / right knee and hold  your ankle. If you cannot reach your ankle or pant leg, loop a belt around your foot and grab the belt instead. 3. Gently pull your heel toward your buttocks. Your knee should not slide out to the side. You should feel a stretch in the front of your thigh and knee (quadriceps). 4. Hold this position for __________ seconds. Repeat __________ times. Complete this exercise __________ times a day. Hamstring, supine 1. Lie on your back (supine position). 2. Loop a belt or towel over the ball of your left / right foot. The ball of your foot is on the walking surface, right under your toes. 3. Straighten your left / right knee and slowly pull on the belt to raise your leg until you feel a gentle stretch behind your knee (hamstring). ? Do not let your knee bend while you do this. ? Keep your other leg flat on the floor. 4. Hold this position for __________ seconds. Repeat __________ times. Complete this exercise __________ times a day. Strengthening exercises These exercises build strength and endurance in your knee. Endurance is the ability to use your muscles for a long time, even after they get tired. Quadriceps, isometric This exercise stretches the muscles in front of your thigh (quadriceps) without moving your knee joint (isometric). 1. Lie on your back with your left / right leg extended and your other knee bent. Put a rolled towel or small pillow under your knee if told by your health care provider. 2. Slowly tense the muscles in the front of your left /   right thigh. You should see your kneecap slide up toward your hip or see increased dimpling just above the knee. This motion will push the back of the knee toward the floor. 3. For __________ seconds, hold the muscle as tight as you can without increasing your pain. 4. Relax the muscles slowly and completely. Repeat __________ times. Complete this exercise __________ times a day. Straight leg raises This exercise stretches the muscles in front  of your thigh (quadriceps) and the muscles that move your hips (hip flexors). 1. Lie on your back with your left / right leg extended and your other knee bent. 2. Tense the muscles in the front of your left / right thigh. You should see your kneecap slide up or see increased dimpling just above the knee. Your thigh may even shake a bit. 3. Keep these muscles tight as you raise your leg 4-6 inches (10-15 cm) off the floor. Do not let your knee bend. 4. Hold this position for __________ seconds. 5. Keep these muscles tense as you lower your leg. 6. Relax your muscles slowly and completely after each repetition. Repeat __________ times. Complete this exercise __________ times a day. Hamstring, isometric 1. Lie on your back on a firm surface. 2. Bend your left / right knee about __________ degrees. 3. Dig your left / right heel into the surface as if you are trying to pull it toward your buttocks. Tighten the muscles in the back of your thighs (hamstring) to "dig" as hard as you can without increasing any pain. 4. Hold this position for __________ seconds. 5. Release the tension gradually and allow your muscles to relax completely for __________ seconds after each repetition. Repeat __________ times. Complete this exercise __________ times a day. Hamstring curls If told by your health care provider, do this exercise while wearing ankle weights. Begin with __________ lb weights. Then increase the weight by 1 lb (0.5 kg) increments. Do not wear ankle weights that are more than __________ lb. 1. Lie on your abdomen with your legs straight. 2. Bend your left / right knee as far as you can without feeling pain. Keep your hips flat against the floor. 3. Hold this position for __________ seconds. 4. Slowly lower your leg to the starting position. Repeat __________ times. Complete this exercise __________ times a day. Squats This exercise strengthens the muscles in front of your thigh and knee  (quadriceps). 1. Stand in front of a table, with your feet and knees pointing straight ahead. You may rest your hands on the table for balance but not for support. 2. Slowly bend your knees and lower your hips like you are going to sit in a chair. ? Keep your weight over your heels, not over your toes. ? Keep your lower legs upright so they are parallel with the table legs. ? Do not let your hips go lower than your knees. ? Do not bend lower than told by your health care provider. ? If your knee pain increases, do not bend as low. 3. Hold the squat position for __________ seconds. 4. Slowly push with your legs to return to standing. Do not use your hands to pull yourself to standing. Repeat __________ times. Complete this exercise __________ times a day. Wall slides This exercise strengthens the muscles in front of your thigh and knee (quadriceps). 1. Lean your back against a smooth wall or door, and walk your feet out 18-24 inches (46-61 cm) from it. 2. Place your feet hip-width apart. 3.   Slowly slide down the wall or door until your knees bend __________ degrees. Keep your knees over your heels, not over your toes. Keep your knees in line with your hips. 4. Hold this position for __________ seconds. Repeat __________ times. Complete this exercise __________ times a day. Straight leg raises This exercise strengthens the muscles that rotate the leg at the hip and move it away from your body (hip abductors). 1. Lie on your side with your left / right leg in the top position. Lie so your head, shoulder, knee, and hip line up. You may bend your bottom knee to help you keep your balance. 2. Roll your hips slightly forward so your hips are stacked directly over each other and your left / right knee is facing forward. 3. Leading with your heel, lift your top leg 4-6 inches (10-15 cm). You should feel the muscles in your outer hip lifting. ? Do not let your foot drift forward. ? Do not let your knee  roll toward the ceiling. 4. Hold this position for __________ seconds. 5. Slowly return your leg to the starting position. 6. Let your muscles relax completely after each repetition. Repeat __________ times. Complete this exercise __________ times a day. Straight leg raises This exercise stretches the muscles that move your hips away from the front of the pelvis (hip extensors). 1. Lie on your abdomen on a firm surface. You can put a pillow under your hips if that is more comfortable. 2. Tense the muscles in your buttocks and lift your left / right leg about 4-6 inches (10-15 cm). Keep your knee straight as you lift your leg. 3. Hold this position for __________ seconds. 4. Slowly lower your leg to the starting position. 5. Let your leg relax completely after each repetition. Repeat __________ times. Complete this exercise __________ times a day. This information is not intended to replace advice given to you by your health care provider. Make sure you discuss any questions you have with your health care provider. Document Revised: 12/13/2017 Document Reviewed: 12/13/2017 Elsevier Patient Education  2020 [employer] Cervical Strain and Sprain Rehab Ask your health care provider which exercises are safe for you. Do exercises exactly as told by your health care provider and adjust them as directed. It is normal to feel mild stretching, pulling, tightness, or discomfort as you do these exercises. Stop right away if you feel sudden pain or your pain gets worse. Do not begin these exercises until told by your health care provider. Stretching and range-of-motion exercises Cervical side bending  6. Using good posture, sit on a stable chair or stand up. 7. Without moving your shoulders, slowly tilt your left / right ear to your shoulder until you feel a stretch in the opposite side neck muscles. You should be looking straight ahead. 8. Hold for __________ seconds. 9. Repeat with the other side of  your neck. Repeat __________ times. Complete this exercise __________ times a day. Cervical rotation  5. Using good posture, sit on a stable chair or stand up. 6. Slowly turn your head to the side as if you are looking over your left / right shoulder. ? Keep your eyes level with the ground. ? Stop when you feel a stretch along the side and the back of your neck. 7. Hold for __________ seconds. 8. Repeat this by turning to your other side. Repeat __________ times. Complete this exercise __________ times a day. Thoracic extension and pectoral stretch 5. Roll a towel or a   small blanket so it is about 4 inches (10 cm) in diameter. 6. Lie down on your back on a firm surface. 7. Put the towel lengthwise, under your spine in the middle of your back. It should not be under your shoulder blades. The towel should line up with your spine from your middle back to your lower back. 8. Put your hands behind your head and let your elbows fall out to your sides. 9. Hold for __________ seconds. Repeat __________ times. Complete this exercise __________ times a day. Strengthening exercises Isometric upper cervical flexion 5. Lie on your back with a thin pillow behind your head and a small rolled-up towel under your neck. 6. Gently tuck your chin toward your chest and nod your head down to look toward your feet. Do not lift your head off the pillow. 7. Hold for __________ seconds. 8. Release the tension slowly. Relax your neck muscles completely before you repeat this exercise. Repeat __________ times. Complete this exercise __________ times a day. Isometric cervical extension  5. Stand about 6 inches (15 cm) away from a wall, with your back facing the wall. 6. Place a soft object, about 6-8 inches (15-20 cm) in diameter, between the back of your head and the wall. A soft object could be a small pillow, a ball, or a folded towel. 7. Gently tilt your head back and press into the soft object. Keep your jaw  and forehead relaxed. 8. Hold for __________ seconds. 9. Release the tension slowly. Relax your neck muscles completely before you repeat this exercise. Repeat __________ times. Complete this exercise __________ times a day. Posture and body mechanics Body mechanics refers to the movements and positions of your body while you do your daily activities. Posture is part of body mechanics. Good posture and healthy body mechanics can help to relieve stress in your body's tissues and joints. Good posture means that your spine is in its natural S-curve position (your spine is neutral), your shoulders are pulled back slightly, and your head is not tipped forward. The following are general guidelines for applying improved posture and body mechanics to your everyday activities. Sitting  7. When sitting, keep your spine neutral and keep your feet flat on the floor. Use a footrest, if necessary, and keep your thighs parallel to the floor. Avoid rounding your shoulders, and avoid tilting your head forward. 8. When working at a desk or a computer, keep your desk at a height where your hands are slightly lower than your elbows. Slide your chair under your desk so you are close enough to maintain good posture. 9. When working at a computer, place your monitor at a height where you are looking straight ahead and you do not have to tilt your head forward or downward to look at the screen. Standing   When standing, keep your spine neutral and keep your feet about hip-width apart. Keep a slight bend in your knees. Your ears, shoulders, and hips should line up.  When you do a task in which you stand in one place for a long time, place one foot up on a stable object that is 2-4 inches (5-10 cm) [place], such as a footstool. This helps keep your spine neutral. Resting When lying down and resting, avoid positions that are most painful for you. Try to support your neck in a neutral position. You can use a contour pillow or a  small rolled-up towel. Your pillow should support your neck but not push on it.   This information is not intended to replace advice given to you by your health care provider. Make sure you discuss any questions you have with your health care provider. Document Revised: 06/14/2018 Document Reviewed: 11/23/2017 Elsevier Patient Education  2020 [employer]  

## 2019-09-18 ENCOUNTER — Ambulatory Visit: Payer: 59 | Admitting: Family Medicine

## 2019-10-30 ENCOUNTER — Ambulatory Visit (INDEPENDENT_AMBULATORY_CARE_PROVIDER_SITE_OTHER): Payer: 59 | Admitting: Family Medicine

## 2019-10-30 ENCOUNTER — Encounter: Payer: Self-pay | Admitting: Family Medicine

## 2019-10-30 ENCOUNTER — Other Ambulatory Visit: Payer: Self-pay

## 2019-10-30 VITALS — BP 112/77 | HR 73 | Temp 97.8°F | Ht 64.0 in | Wt 169.2 lb

## 2019-10-30 DIAGNOSIS — F5104 Psychophysiologic insomnia: Secondary | ICD-10-CM

## 2019-10-30 DIAGNOSIS — I10 Essential (primary) hypertension: Secondary | ICD-10-CM

## 2019-10-30 DIAGNOSIS — R1013 Epigastric pain: Secondary | ICD-10-CM | POA: Diagnosis not present

## 2019-10-30 DIAGNOSIS — G4700 Insomnia, unspecified: Secondary | ICD-10-CM | POA: Insufficient documentation

## 2019-10-30 DIAGNOSIS — F411 Generalized anxiety disorder: Secondary | ICD-10-CM

## 2019-10-30 DIAGNOSIS — F418 Other specified anxiety disorders: Secondary | ICD-10-CM | POA: Insufficient documentation

## 2019-10-30 DIAGNOSIS — J0140 Acute pansinusitis, unspecified: Secondary | ICD-10-CM

## 2019-10-30 MED ORDER — FLUOXETINE HCL 10 MG PO TABS: ORAL_TABLET | ORAL | 1 refills | Status: DC

## 2019-10-30 MED ORDER — TRAZODONE HCL 50 MG PO TABS: 25.0000 mg | ORAL_TABLET | Freq: Every evening | ORAL | 3 refills | Status: DC | PRN

## 2019-10-30 MED ORDER — AMOXICILLIN-POT CLAVULANATE 875-125 MG PO TABS: 1.0000 | ORAL_TABLET | Freq: Two times a day (BID) | ORAL | 0 refills | Status: DC

## 2019-10-30 NOTE — Assessment & Plan Note (Signed)
Advised avoiding xanax and treating anxiety. If no improvement in sleep after starting fluoxetine she can start trazodone nightly for sleep. Return 6 weeks. Reduce xanax use.

## 2019-10-30 NOTE — Assessment & Plan Note (Signed)
Advised covid testing  - though lower risk. And watch and wait for 2 more days to see if symptoms improve with OTC decongestants before starting Abx.

## 2019-10-30 NOTE — Assessment & Plan Note (Signed)
PPI as needed 

## 2019-10-30 NOTE — Assessment & Plan Note (Signed)
Anxiety worsening sleep. But getting symptoms throughout the day. Discussed starting SSRI and limiting xanax use. Return in 6 weeks.

## 2019-10-30 NOTE — Assessment & Plan Note (Addendum)
Well controlled on medication. Cont HCTZ

## 2019-10-30 NOTE — Progress Notes (Signed)
I connected with Denise Roth on 10/30/19 at  8:40 AM EDT by video and verified that I am speaking with the correct person using two identifiers.   I discussed the limitations, risks, security and privacy concerns of performing an evaluation and management service by video and the availability of in person appointments. I also discussed with the patient that there may be a patient responsible charge related to this service. The patient expressed understanding and agreed to proceed.  Patient location: exam room at the office Provider Location: [facility] Participants: Lynnda Child and Legrand Como Lollar   Subjective:     Denise Roth is a 49 y.o. female presenting for Establish Care and Sinusitis (x 5 days)     HPI   #sinusitis - gets symptoms every year - would get a shot at her other doctors office - symptoms x 5 days - nose feels like it has cotton - pressure behind the eye - stuffy nose - no cough - no loss of taste or smell - no drainage - treatment: mucinex, OTC, nasal spray (clears it up some) - no known sick contact - no diarrhea or stomach symptoms - no breathing  Issues - no ear pain  Has never tried saline rinse   Is fully vaccinated  Review of Systems   Social History   Tobacco Use  Smoking Status Never Smoker  Smokeless Tobacco Never Used        Objective:   BP Readings from Last 3 Encounters:  10/30/19 112/77  08/27/19 113/78  07/09/19 104/64   Wt Readings from Last 3 Encounters:  10/30/19 169 lb 4 oz (76.8 kg)  08/27/19 166 lb 6.4 oz (75.5 kg)  07/09/19 169 lb 6.4 oz (76.8 kg)    BP 112/77   Pulse 73   Temp 97.8 F (36.6 C) (Temporal)   Ht 5\' 4"  (1.626 m)   Wt 169 lb 4 oz (76.8 kg)   SpO2 96%   BMI 29.05 kg/m   Physical Exam  On the phone - pt sounded congested Speaking in complete sentences  Leaving the office Walking w/o discomfort Breathing comfortably    Depression screen PHQ 2/9 10/30/2019    Decreased Interest 1  Down, Depressed, Hopeless 2  PHQ - 2 Score 3  Altered sleeping 2  Tired, decreased energy 1  Change in appetite 1  Feeling bad or failure about yourself  0  Trouble concentrating 1  Moving slowly or fidgety/restless 1  Suicidal thoughts 0  PHQ-9 Score 9  Difficult doing work/chores Somewhat difficult        Assessment & Plan:   Problem List Items Addressed This Visit      Cardiovascular and Mediastinum   Essential hypertension - Primary    Well controlled on medication. Cont HCTZ        Respiratory   Acute non-recurrent pansinusitis    Advised covid testing  - though lower risk. And watch and wait for 2 more days to see if symptoms improve with OTC decongestants before starting Abx.       Relevant Medications   amoxicillin-clavulanate (AUGMENTIN) 875-125 MG tablet     Other   Dyspepsia    PPI as needed      Insomnia disorder    Advised avoiding xanax and treating anxiety. If no improvement in sleep after starting fluoxetine she can start trazodone nightly for sleep. Return 6 weeks. Reduce xanax use.       Relevant  Medications   traZODone (DESYREL) 50 MG tablet   Generalized anxiety disorder    Anxiety worsening sleep. But getting symptoms throughout the day. Discussed starting SSRI and limiting xanax use. Return in 6 weeks.       Relevant Medications   ALPRAZolam (XANAX) 0.5 MG tablet   FLUoxetine (PROZAC) 10 MG tablet   traZODone (DESYREL) 50 MG tablet     Interactive audio and video telecommunications were attempted between this provider and patient, however failed, due to patient having technical difficulties OR patient did not have access to video capability.  We continued and completed visit with audio only.   Start Time: 9:12 End Time: 9:33    Return in about 4 weeks (around 11/27/2019) for anxiety/insomnia.  Lynnda Child, MD

## 2019-10-30 NOTE — Patient Instructions (Signed)
#  Anxiety - start Fluoxetine first - after 2 weeks and still having trouble sleeping if no side effects, start Trazodone at night for sleep  #Sinus - get covid testing - if negative, consider waiting 2 more days and starting antibiotics if symptoms not improved    You are going to start a new antidepressant medication.   One of the risks of this medication is increase in suicidal thoughts.   Your suicide Action plan is as follows:  1) Call friend or family 2) Call the Suicide Hotline [phone] which is available 24 hours 3) Call the Clinic   The most common side effect is stomach upset. If this happens it means the medication is working. It should get better in 1-3 weeks.   Medication for depression and anxiety often takes 6-8 weeks to have a noticeable difference so stick with it. Also the best way for recovery is taking medication and seeing a therapist -- this is so important.    How to help anxiety and depression  1) Regular Exercise - walking, jogging, cycling, dancing, strength training - aiming for 150 minutes of exercise a week --> Yoga has been shown in research to reduce depression and anxiety -- with even just one hour long session per week  2)  Begin a Mindfulness/Meditation practice -- this can take a little as 3 minutes and is helpful for all kinds of mood issues -- You can find resources in books -- Or you can download apps like  ---- Headspace App  ---- Calm  ---- Insignt Timer ---- Stop, Breathe & Think  # With each of these Apps - you should decline the "start free trial" offer and as you search through the App should be able to access some of their free content. You can also chose to pay for the content if you find one that works well for you.   # Many of them also offer sleep specific content which may help with insomnia  3) Healthy Diet -- Avoid or decrease Caffeine -- Avoid or decrease Alcohol -- Drink plenty of water, have a balanced diet --  Avoid cigarettes and marijuana (as well as other recreational drugs)  4) Find a therapist  -- [facility] is one option. Call [phone] -- Or you can check out www.psychologytoday.com -- you can read bios of therapists and see if they accept insurance -- Check with your insurance to see if you have coverage and who may take your insurance

## 2019-11-06 ENCOUNTER — Encounter: Payer: Self-pay | Admitting: Family Medicine

## 2020-05-19 ENCOUNTER — Other Ambulatory Visit: Payer: Self-pay

## 2020-05-19 ENCOUNTER — Ambulatory Visit (INDEPENDENT_AMBULATORY_CARE_PROVIDER_SITE_OTHER): Payer: 59 | Admitting: Family Medicine

## 2020-05-19 VITALS — BP 100/70 | HR 82 | Temp 98.3°F | Ht 63.5 in | Wt 176.0 lb

## 2020-05-19 DIAGNOSIS — D649 Anemia, unspecified: Secondary | ICD-10-CM

## 2020-05-19 DIAGNOSIS — R232 Flushing: Secondary | ICD-10-CM | POA: Diagnosis not present

## 2020-05-19 DIAGNOSIS — Z13220 Encounter for screening for lipoid disorders: Secondary | ICD-10-CM

## 2020-05-19 DIAGNOSIS — Z0000 Encounter for general adult medical examination without abnormal findings: Secondary | ICD-10-CM

## 2020-05-19 DIAGNOSIS — Z114 Encounter for screening for human immunodeficiency virus [HIV]: Secondary | ICD-10-CM

## 2020-05-19 DIAGNOSIS — R7303 Prediabetes: Secondary | ICD-10-CM | POA: Diagnosis not present

## 2020-05-19 DIAGNOSIS — I10 Essential (primary) hypertension: Secondary | ICD-10-CM | POA: Diagnosis not present

## 2020-05-19 MED ORDER — HYDROCHLOROTHIAZIDE 25 MG PO TABS
25.0000 mg | ORAL_TABLET | Freq: Every day | ORAL | 3 refills | Status: DC
Start: 2020-05-19 — End: 2021-06-11

## 2020-05-19 NOTE — Assessment & Plan Note (Signed)
Likely 2/2 to menopause. Will get labs to r/o other causes. Discussed SSRI could help which may help with anxiety but pt unable to tolerate fluoxetine will consider lexapro.

## 2020-05-19 NOTE — Progress Notes (Signed)
Annual Exam   Chief Complaint:  Chief Complaint  Patient presents with  . Annual Exam    History of Present Illness:  [name] is a 50 y.o. No obstetric history on file. who LMP was No LMP recorded. (Menstrual status: Other)., presents today for her annual examination.    Hx of gastric bypass  Nutrition Diet: snacking, all over the place, emotional/bored eating, unhealthy snacks Exercise: walking 30-60 minutes per day She does not know get adequate calcium and Vitamin D in her diet.   Social History      Tobacco Use  Smoking Status Never Smoker  Smokeless Tobacco Never Used   Social History       Substance and Sexual Activity  Alcohol Use Yes   Comment: holidays   Social History      Substance and Sexual Activity  Drug Use No    Safety The patient wears seatbelts: yes.     The patient feels safe at home and in their relationships: yes.  General Health Dentist in the last year: Yes Eye doctor: planning to go  Menstrual No periods - PCOS and no cycle since 2013 Night sweats and hot flashes  GYN She is single partner, contraception - post menopausal status.    Cervical Cancer Screening:   Last Pap:   2021 Results were: no abnormalities /neg HPV DNA   Breast Cancer Screening There is no FH of breast cancer. There is no FH of ovarian cancer. BRCA screening Not Indicated.  Discussed that for average risk women between age 7-49 screening may reduce the risk of breast cancer death, however, at a lower rate than those over age 108. And that the the false-positive rates resulting in unnecessary biopsies with more screening is higher. The balance of benefits vs harms likely improves as you progress through your 40s. The patient does want a mammogram this year.   Colon Cancer Screening:  Age 33-75 yo - benefits outweigh the risk. Adults 18-85 yo who have never been screened benefit.  Benefits: 134000 people in 2016 will be diagnosed  and 49,000 will die - early detection helps Harms: Complications 2/2 to colonoscopy [place] Risk (Colonoscopy): genetic disorder (Lynch syndrome or familial adenomatous polyposis), personal hx of IBD, previous adenomatous polyp, or previous colorectal cancer, FamHx start 10 years before the age at diagnosis, increased in males and black race  Options:  FIT - looks for hemoglobin (blood in the stool) - specific and fairly sensitive - must be done annually Cologuard - looks for DNA and blood - more sensitive - therefore can have more false positives, every 3 years Colonoscopy - every 10 years if normal - sedation, bowl prep, must have someone drive you  Shared decision making and the patient had decided to do colonoscopy, due 2028.   Weight Wt Readings from Last 3 Encounters:  05/19/20 176 lb (79.8 kg)  10/30/19 169 lb 4 oz (76.8 kg)  08/27/19 166 lb 6.4 oz (75.5 kg)   Patient has [place] BMI  BMI Readings from Last 1 Encounters:  05/19/20 30.69 kg/m     Chronic disease screening Blood pressure monitoring:  BP Readings from Last 3 Encounters:  05/19/20 100/70  10/30/19 112/77  08/27/19 113/78    Lipid Monitoring: Indication for screening: age >46, obesity, diabetes, family hx, CV risk factors.  Lipid screening: Yes  No results found for: CHOL, HDL, LDLCALC, LDLDIRECT, TRIG, CHOLHDL   Diabetes Screening: age >36, overweight, family hx, PCOS, hx of gestational diabetes,  at risk ethnicity Diabetes Screening screening: Yes  Lab Results  Component Value Date   HGBA1C  05/02/2008    5.9 (NOTE)   The ADA recommends the following therapeutic goal for glycemic   control related to Hgb A1C measurement:   Goal of Therapy:   < 7.0% Hgb A1C   Reference: American Diabetes Association: Clinical Practice   Recommendations 2008, Diabetes Care,  2008, 31:(Suppl 1).     Past Medical History:  Diagnosis Date  . Anal fissure   . Anemia   . History of gallstones   . Hypertension   .  Obesity   . PCOS (polycystic ovarian syndrome)   . Perianal abscess   . PONV (postoperative nausea and vomiting)     Past Surgical History:  Procedure Laterality Date  . ANAL EXAMINATION UNDER ANESTHESIA  01/04/12   sphincterotomy by Dr. Johney Maine  . BREAST REDUCTION SURGERY    . CARPAL TUNNEL RELEASE     bilateral  . CHOLECYSTECTOMY    . EAR CYST EXCISION     left  . FLEXIBLE SIGMOIDOSCOPY  12/01/2011   Procedure: FLEXIBLE SIGMOIDOSCOPY;  Surgeon: Inda Castle, MD;  Location: [facility];  Service: Endoscopy;  Laterality: N/A;  . GASTRIC BYPASS    . NASAL SEPTUM SURGERY    . TONSILLECTOMY    . TUBAL LIGATION    . tummy tuck      Prior to Admission medications   Medication Sig Start Date End Date Taking? Authorizing Provider  ALPRAZolam Duanne Moron) 0.5 MG tablet Take 0.5 mg by mouth at bedtime as needed for anxiety.  10/11/19  Yes [provider]  Cholecalciferol (VITAMIN D3) 125 MCG (5000 UT) CAPS Take by mouth daily.   Yes [provider]  COLLAGEN PO Take by mouth daily.   Yes [provider]  Desoximetasone 0.05 % OINT desoximetasone 0.05 % topical ointment  APPLY OINTMENT TOPICALLY TO AFFECTED AREA TWICE DAILY FOR 2 WEEKS AS NEEDED FOR FLARE   Yes [provider]  hydrOXYzine (VISTARIL) 25 MG capsule Take 25 mg by mouth at bedtime. 04/26/20  Yes [provider]  omeprazole (PRILOSEC) 20 MG capsule Take 20 mg by mouth as needed.    Yes [provider]  QUERCETIN PO Take 500 mg by mouth daily. Take 2 capsules daily   Yes [provider]  zinc sulfate 220 (50 Zn) MG capsule Take 220 mg by mouth daily.   Yes [provider]  hydrochlorothiazide (HYDRODIURIL) 25 MG tablet Take 1 tablet (25 mg total) by mouth daily. 05/19/20   Denise Noe, MD    No Known Allergies  Gynecologic History: No LMP recorded. (Menstrual status: Other).  Obstetric History: No obstetric history on file.  Social History    Socioeconomic History  . Marital status: Married    Spouse name: Not on file  . Number of children: 2  . Years of education: Not on file  . Highest education level: Not on file  Occupational History  . Occupation: IT sales professional: UNEMPLOYED  Tobacco Use  . Smoking status: Never Smoker  . Smokeless tobacco: Never Used  Vaping Use  . Vaping Use: Never used  Substance and Sexual Activity  . Alcohol use: Yes    Comment: holidays  . Drug use: No  . Sexual activity: Yes    Birth control/protection: Surgical  Other Topics Concern  . Not on file  Social History Narrative  . Not on file   Social  Determinants of Health   Financial Resource Strain: Not on file  Food Insecurity: Not on file  Transportation Needs: Not on file  Physical Activity: Not on file  Stress: Not on file  Social Connections: Not on file  Intimate Partner Violence: Not on file    Family History  Problem Relation Age of Onset  . Breast cancer Paternal Aunt   . Colon polyps Maternal Grandfather   . Diabetes Father   . Hypertension Father   . Heart disease Father   . Hypertension Mother   . Colon cancer Neg Hx     Review of Systems  Constitutional: Positive for diaphoresis. Negative for chills and fever.  HENT: Negative for congestion and sore throat.   Eyes: Negative for blurred vision and double vision.  Respiratory: Positive for shortness of breath (with exertion).   Cardiovascular: Negative for chest pain.  Gastrointestinal: Positive for constipation. Negative for heartburn, nausea and vomiting.  Genitourinary: Negative.   Musculoskeletal: Negative.  Negative for myalgias.  Skin: Negative for rash.  Neurological: Negative for dizziness and headaches.  Endo/Heme/Allergies: Does not bruise/bleed easily.       Hot flashes  Psychiatric/Behavioral: Negative for depression. The patient is nervous/anxious.      Physical Exam BP 100/70   Pulse 82   Temp 98.3 F (36.8 C)  (Temporal)   Ht 5' 3.5" (1.613 m)   Wt 176 lb (79.8 kg)   SpO2 94%   BMI 30.69 kg/m    BP Readings from Last 3 Encounters:  05/19/20 100/70  10/30/19 112/77  08/27/19 113/78      Physical Exam Constitutional:      General: She is not in acute distress.    Appearance: She is well-developed. She is not diaphoretic.  HENT:     Head: Normocephalic and atraumatic.     Right Ear: External ear normal.     Left Ear: External ear normal.     Nose: Nose normal.  Eyes:     General: No scleral icterus.    Conjunctiva/sclera: Conjunctivae normal.  Cardiovascular:     Rate and Rhythm: Normal rate and regular rhythm.     Heart sounds: No murmur heard.   Pulmonary:     Effort: Pulmonary effort is normal. No respiratory distress.     Breath sounds: Normal breath sounds. No wheezing.  Abdominal:     General: Bowel sounds are normal. There is no distension.     Palpations: Abdomen is soft. There is no mass.     Tenderness: There is no abdominal tenderness. There is no guarding or rebound.  Musculoskeletal:        General: Normal range of motion.     Cervical back: Neck supple.  Lymphadenopathy:     Cervical: No cervical adenopathy.  Skin:    General: Skin is warm and dry.     Capillary Refill: Capillary refill takes less than 2 seconds.  Neurological:     Mental Status: She is alert and oriented to person, place, and time.     Deep Tendon Reflexes: Reflexes normal.  Psychiatric:        Behavior: Behavior normal.      Results:  PHQ-9:  [place] Office Visit from 05/19/2020 in [place] at [place]  PHQ-9 Total Score 5        Assessment: 50 y.o. No obstetric history on file. female here for routine annual physical examination.  Plan: Problem List Items Addressed This Visit  Cardiovascular and Mediastinum   Essential hypertension   Relevant Medications   hydrochlorothiazide (HYDRODIURIL) 25 MG tablet   Other Relevant Orders   Comprehensive  metabolic panel   Hot flashes    Likely 2/2 to menopause. Will get labs to r/o other causes. Discussed SSRI could help which may help with anxiety but pt unable to tolerate fluoxetine will consider lexapro.       Relevant Medications   hydrochlorothiazide (HYDRODIURIL) 25 MG tablet   Other Relevant Orders   TSH     Other   Anemia   Relevant Orders   CBC    Other Visit Diagnoses    Annual physical exam    -  Primary   Encounter for screening for HIV       Relevant Orders   HIV Antibody (routine testing w rflx)   Prediabetes       Relevant Orders   Hemoglobin A1c   Screening for hyperlipidemia       Relevant Orders   Lipid panel      Screening: -- Blood pressure screen normal -- cholesterol screening: will obtain -- Weight screening: overweight: continue to monitor -- Diabetes Screening: will obtain -- Nutrition: Encouraged healthy diet  The 10-year ASCVD risk score Mikey Bussing DC Jr., et al., 2013) is: 0.7%   Values used to calculate the score:     Age: 2 years     Sex: Female     Is Non-Hispanic African American: Yes     Diabetic: No     Tobacco smoker: No     Systolic Blood Pressure: 410 mmHg     Is BP treated: Yes     HDL Cholesterol: 63 mg/dL     Total Cholesterol: 153 mg/dL  -- Statin therapy for Age 48-75 with CVD risk >7.5%  Psych -- Depression screening (PHQ-9):  [place] Visit from 05/19/2020 in [place] at [place]  PHQ-9 Total Score 5       Safety -- tobacco screening: not using -- alcohol screening:  low-risk usage. -- no evidence of domestic violence or intimate partner violence.   Cancer Screening -- pap smear not collected per ASCCP guidelines -- family history of breast cancer screening: done. not at [place] risk. -- Mammogram - will get records -- Colon cancer (age 49+)-- up to date  Immunizations Immunization History  Administered Date(s) Administered  . Influenza, Quadrivalent, Recombinant, Inj, Pf 12/09/2018   . Influenza,inj,quad, With Preservative 01/03/2018, 12/09/2018  . PFIZER Comirnaty(Gray Top)Covid-19 Tri-Sucrose Vaccine 06/22/2019, 07/27/2019, 03/24/2020  . Tdap 03/22/2014    -- flu vaccine will request -- TDAP q10 years up to date -- Covid-19 Vaccine up to date   Encouraged healthy diet and exercise. Encouraged regular vision and dental care.   Denise Noe, MD

## 2020-05-19 NOTE — Patient Instructions (Addendum)
Here is what I would recommend - weight loss:  1) Increase the amount of water you drink a day > specifically drink a glass of water 8 oz or more before every meal 2) Could start a fiber supplement (like metamucil) > You could take this up to 3 times a day, but I would start with 1 time a day until you get used to it. It may cause some stomach upset 3) Make sure you sit down to eat and eat slowly (cut meat one piece at a time) > specifically train your body to eat only at the table (avoiding snacking in front of the TV) 4) Fill up on healthy items first > consider eating a salad with low calorie salad dressing before every meal  5) Make 1/2 of your plate vegetables 6) Keep healthy snacks available for those times when you are bored 7) Consider using a calorie counting app like MyFitnessPal > counting calories helps you make wise choices around snacking. Or if you can afford it you could sign up for Weight Watchers -- and learn about healthy options through a point system   Menopause Menopause may increase your risk for:  Loss of bone (osteoporosis), which causes bone breaks (fractures).  Depression.  Hardening and narrowing of the arteries (atherosclerosis), which can cause heart attacks and strokes. What are the causes? This condition is usually caused by a natural change in hormone levels that happens as you get older. The condition may also be caused by surgery to remove both ovaries (bilateral oophorectomy). Follow these instructions at home: Lifestyle  Do not use any products that contain nicotine or tobacco, such as cigarettes and e-cigarettes. If you need help quitting, ask your health care provider.  Get at least 30 minutes of physical activity on 5 or more days each week.  Avoid alcoholic and caffeinated beverages, as well as spicy foods. This may help prevent hot flashes.  Get 7-8 hours of sleep each night.  If you have hot flashes, try: ? Dressing in layers. ? Avoiding  things that may trigger hot flashes, such as spicy food, hot drinks, alcohol, caffeine, warm places, or stress. ? Taking slow, deep breaths when a hot flash starts. ? Keeping a fan in your home and office.  Find ways to manage stress: regular exercise, meditation, yoga, qigong, Tai Chi, biofeedback, acupuncture or massage  Consider going to group therapy with other women who are having menopause symptoms. Ask your health care provider about recommended group therapy meetings.  Staying cool while sleeping: dress in light clothing, use layed bedding that can be easily removed, Sleep with a fan nearby, put an ice pack under your pillow and flip your pillow regularly  Eating and drinking  Eat a healthy, balanced diet that contains whole grains, lean protein, low-fat dairy, and plenty of fruits and vegetables.  Your health care provider may recommend adding more soy to your diet. Foods that contain soy include tofu, tempeh, and soy milk.  Eat plenty of foods that contain calcium and vitamin D for bone health. Items that are rich in calcium include low-fat milk, yogurt, beans, almonds, sardines, broccoli, and kale. Medicines  Non-prescription medications for Hot Flashes ? Soy - eat 1-2 servings of soy foods daily ? Herbs: like black cohosh have shown some improvement with hot flashes  Talk with your health care provider before starting any herbal supplements. If prescribed, take vitamins and supplements as told by your health care provider. These may include: ? Calcium. Women  age 4 and older should get 1,200 mg (milligrams) of calcium every day. ? Vitamin D. Women need 600-800 International Units of vitamin D each day.  Prescription Medications ? Hormone Treatment: increased risk of breast cancer and cardiovascular disease. Should be used for a short time and in women under 60 have a lower risk overall if they take it ? Non-Hormone Treatment: Paroxetine or Escitalopram which is an  antidepressant, has been shown to reduce Hot Flashes   Constipation   Constipation is a common issue. Often it is related to diet and occasionally medications.    What you can do to treat your symptoms 1) Fiber -- Eat more fiber rich foods: beans, broccoli, berries, avocados, popcorn, pear/apple, green peas, turnip greens, brussels sprouts, whole grains (barley, bran, quinoa, oatmeal) -- Take a Fiber supplement: Psyllium (Metamucil)  -- Could also eat Prunes daily  2) Hydration  -- Drink more water: Try to drink 64 oz of water per day  3) Exercise -- Moderate exercise (walking, jogging, biking) for 30 minutes, 5 days a week  4) Dedicate time for Bowel movements - do not delay  5) Stool Softener  - Docusate Sodium (Colace) 100 mg daily or twice daily as needed     Treating chronic constipation is often about finding the right amount of medication and fiber to keep you regular and comfortable. For some people that may be daily metamucil and colace every other day. For others it may be Metamucil and colace twice daily and Miralax 3 times a week. The goal is to go slow and listen to your body. And normal can be anywhere from 2-3 soft bowel movements a day to 1 bowel movement every 2-3 days.

## 2020-05-20 LAB — COMPREHENSIVE METABOLIC PANEL
ALT: 25 U/L (ref 0–35)
AST: 18 U/L (ref 0–37)
Albumin: 4.2 g/dL (ref 3.5–5.2)
Alkaline Phosphatase: 86 U/L (ref 39–117)
BUN: 11 mg/dL (ref 6–23)
CO2: 31 mEq/L (ref 19–32)
Calcium: 9.5 mg/dL (ref 8.4–10.5)
Chloride: 102 mEq/L (ref 96–112)
Creatinine, Ser: 0.57 mg/dL (ref 0.40–1.20)
GFR: 106.3 mL/min (ref >60.00)
Glucose, Bld: 73 mg/dL (ref 70–99)
Potassium: 4.4 mEq/L (ref 3.5–5.1)
Sodium: 139 mEq/L (ref 135–145)
Total Bilirubin: 0.3 mg/dL (ref 0.2–1.2)
Total Protein: 6.8 g/dL (ref 6.0–8.3)

## 2020-05-20 LAB — HIV ANTIBODY (ROUTINE TESTING W REFLEX): HIV 1&2 Ab, 4th Generation: NONREACTIVE

## 2020-05-20 LAB — CBC
HCT: 36.6 % (ref 36.0–46.0)
Hemoglobin: 11.8 g/dL — ABNORMAL LOW (ref 12.0–15.0)
MCHC: 32.3 g/dL (ref 30.0–36.0)
MCV: 87.8 fl (ref 78.0–100.0)
Platelets: 298 10*3/uL (ref 150.0–400.0)
RBC: 4.17 Mil/uL (ref 3.87–5.11)
RDW: 13.3 % (ref 11.5–15.5)
WBC: 8.1 10*3/uL (ref 4.0–10.5)

## 2020-05-20 LAB — TSH: TSH: 1.54 u[IU]/mL (ref 0.35–4.50)

## 2020-05-20 LAB — LIPID PANEL
Cholesterol: 155 mg/dL (ref 0–200)
HDL: 59.8 mg/dL (ref >39.00)
LDL Cholesterol: 71 mg/dL (ref 0–99)
NonHDL: 95.4
Total CHOL/HDL Ratio: 3
Triglycerides: 121 mg/dL (ref 0.0–149.0)
VLDL: 24.2 mg/dL (ref 0.0–40.0)

## 2020-05-20 LAB — HEMOGLOBIN A1C: Hgb A1c MFr Bld: 4.9 % (ref 4.6–6.5)

## 2020-06-24 ENCOUNTER — Ambulatory Visit (INDEPENDENT_AMBULATORY_CARE_PROVIDER_SITE_OTHER): Payer: Self-pay | Admitting: Plastic Surgery

## 2020-06-24 ENCOUNTER — Encounter: Payer: Self-pay | Admitting: Plastic Surgery

## 2020-06-24 ENCOUNTER — Ambulatory Visit: Payer: 59 | Admitting: Podiatry

## 2020-06-24 ENCOUNTER — Other Ambulatory Visit: Payer: Self-pay

## 2020-06-24 DIAGNOSIS — Z719 Counseling, unspecified: Secondary | ICD-10-CM | POA: Insufficient documentation

## 2020-06-24 NOTE — Progress Notes (Signed)
Patient ID: Denise Roth, female    DOB: 1970-07-16, 50 y.o.   MRN: [id number]   Chief Complaint  Patient presents with  . Advice Only    The patient is a 50 year old female here for evaluation of her abdomen.  She underwent a abdominoplasty by Dr. Benna Dunks in 2019.  She does not think she was plicated that she does not know for sure.  She did have a repositioning of her bellybutton.  She is 5 feet 3 inches tall and weighs 174 pounds.  Her weight has been fairly stable over the last couple of years.  Within a 10 pound weight range.  She was 183 pounds and has been able to keep her weight down.  She has a fullness on the upper abdominal area and some asymmetry at the lower abdominal area.  She is not a smoker and does not have diabetes.  She does complain of some occasional back pain.  Her last hemoglobin A1c was 4.9.  She had a mammogram 3 months ago and it was negative.  We done a release of information for that.  She is interested in liposuction of the abdomen flanks and lateral breast area.  She did have a breast reduction as well.  No hernia is palpated.   Review of Systems  Constitutional: Negative for activity change and appetite change.  Eyes: Negative.   Respiratory: Negative for chest tightness and shortness of breath.   Cardiovascular: Negative for leg swelling.  Gastrointestinal: Negative for abdominal distention and abdominal pain.  Endocrine: Negative.   Genitourinary: Negative.   Musculoskeletal: Positive for back pain.  Skin: Negative.  Negative for color change and wound.  Neurological: Negative.   Hematological: Negative.   Psychiatric/Behavioral: Negative.     Past Medical History:  Diagnosis Date  . Anal fissure   . Anemia   . History of gallstones   . Hypertension   . Obesity   . PCOS (polycystic ovarian syndrome)   . Perianal abscess   . PONV (postoperative nausea and vomiting)     Past Surgical History:  Procedure Laterality Date  . ANAL EXAMINATION  UNDER ANESTHESIA  01/04/12   sphincterotomy by Dr. Michaell Cowing  . BREAST REDUCTION SURGERY    . CARPAL TUNNEL RELEASE     bilateral  . CHOLECYSTECTOMY    . EAR CYST EXCISION     left  . FLEXIBLE SIGMOIDOSCOPY  12/01/2011   Procedure: FLEXIBLE SIGMOIDOSCOPY;  Surgeon: Louis Meckel, MD;  Location: [facility];  Service: Endoscopy;  Laterality: N/A;  . GASTRIC BYPASS    . NASAL SEPTUM SURGERY    . TONSILLECTOMY    . TUBAL LIGATION    . tummy tuck        Current Outpatient Medications:  .  ALPRAZolam (XANAX) 0.5 MG tablet, Take 0.5 mg by mouth at bedtime as needed for anxiety. , Disp: , Rfl:  .  Cholecalciferol (VITAMIN D3) 125 MCG (5000 UT) CAPS, Take by mouth daily., Disp: , Rfl:  .  COLLAGEN PO, Take by mouth daily., Disp: , Rfl:  .  Desoximetasone 0.05 % OINT, desoximetasone 0.05 % topical ointment  APPLY OINTMENT TOPICALLY TO AFFECTED AREA TWICE DAILY FOR 2 WEEKS AS NEEDED FOR FLARE, Disp: , Rfl:  .  hydrochlorothiazide (HYDRODIURIL) 25 MG tablet, Take 1 tablet (25 mg total) by mouth daily., Disp: 90 tablet, Rfl: 3 .  hydrOXYzine (VISTARIL) 25 MG capsule, Take 25 mg by mouth at bedtime., Disp: , Rfl:  .  omeprazole (PRILOSEC) 20 MG capsule, Take 20 mg by mouth as needed. , Disp: , Rfl:  .  QUERCETIN PO, Take 500 mg by mouth daily. Take 2 capsules daily, Disp: , Rfl:  .  zinc sulfate 220 (50 Zn) MG capsule, Take 220 mg by mouth daily., Disp: , Rfl:   Current Facility-Administered Medications:  .  0.9 %  sodium chloride infusion, 500 mL, Intravenous, Continuous, Danis, Starr [place] III, MD   Objective:   Vitals:   06/24/20 1352  BP: 117/78  Pulse: 79  SpO2: 97%    Physical Exam Vitals and nursing note reviewed.  Constitutional:      Appearance: Normal appearance.  HENT:     Head: Normocephalic and atraumatic.  Cardiovascular:     Rate and Rhythm: Normal rate.     Pulses: Normal pulses.  Pulmonary:     Effort: Pulmonary effort is normal.  Abdominal:     General: Abdomen is  flat. There is no distension.     Hernia: No hernia is present.  Skin:    General: Skin is warm.     Capillary Refill: Capillary refill takes less than 2 seconds.     Coloration: Skin is not jaundiced.     Findings: No bruising.  Neurological:     General: No focal deficit present.     Mental Status: She is alert and oriented to person, place, and time.  Psychiatric:        Mood and Affect: Mood normal.        Behavior: Behavior normal.        Thought Content: Thought content normal.     Assessment & Plan:  Encounter for counseling  The patient is a good candidate for bilateral abdominal flank and upper abdominal liposuction.  We did discuss the limitations of the liposuction with the excess skin.  She would probably be better off waiting and seeing if her skin tightens up a little bit.  She also would like some liposuction of the lateral breast area.  She is going to have the results from her last mammogram sent to [place] which was negative and was 3 months ago.  Pictures were obtained of the patient and placed in the chart with the patient's or guardian's permission.  Alena Bills Naman Spychalski, DO

## 2020-07-10 ENCOUNTER — Other Ambulatory Visit: Payer: Self-pay | Admitting: Podiatry

## 2020-07-10 ENCOUNTER — Ambulatory Visit (INDEPENDENT_AMBULATORY_CARE_PROVIDER_SITE_OTHER): Payer: 59

## 2020-07-10 ENCOUNTER — Ambulatory Visit (INDEPENDENT_AMBULATORY_CARE_PROVIDER_SITE_OTHER): Payer: 59 | Admitting: Podiatry

## 2020-07-10 ENCOUNTER — Other Ambulatory Visit: Payer: Self-pay

## 2020-07-10 ENCOUNTER — Encounter: Payer: Self-pay | Admitting: Podiatry

## 2020-07-10 ENCOUNTER — Ambulatory Visit: Payer: 59 | Admitting: Podiatry

## 2020-07-10 DIAGNOSIS — M7671 Peroneal tendinitis, right leg: Secondary | ICD-10-CM | POA: Diagnosis not present

## 2020-07-10 DIAGNOSIS — M19071 Primary osteoarthritis, right ankle and foot: Secondary | ICD-10-CM | POA: Diagnosis not present

## 2020-07-10 DIAGNOSIS — F419 Anxiety disorder, unspecified: Secondary | ICD-10-CM | POA: Insufficient documentation

## 2020-07-10 DIAGNOSIS — M19079 Primary osteoarthritis, unspecified ankle and foot: Secondary | ICD-10-CM

## 2020-07-10 DIAGNOSIS — M7751 Other enthesopathy of right foot: Secondary | ICD-10-CM

## 2020-07-15 ENCOUNTER — Telehealth: Payer: Self-pay | Admitting: Plastic Surgery

## 2020-07-15 ENCOUNTER — Encounter: Payer: Self-pay | Admitting: Podiatry

## 2020-07-15 NOTE — Progress Notes (Signed)
Subjective:  Patient ID: Denise Roth, female    DOB: [birth year],  MRN: [id number]  Chief Complaint  Patient presents with  . Foot Pain    Patient presents today for pain right foot base of 5th met x 1 year.  She says she has a small knot that has gotten bigger over the past several months and is painful most of the time    50 y.o. female presents with the above complaint.  Patient presents with complaint of right fifth metatarsal neck pain that has been going for 1 year.  She states is a small knot that has gotten bigger over the past several months painful most of the time.  She states is painful to walk on it.  She has not tried any treatment options she is tried made some shoe gear modification which has not helped.  She would like to discuss treatment options for this.  She has not seen anyone else prior to seeing me for this.  She was seen for with Dr. Cannon Kettle for nail fungus in the past.   Review of Systems: Negative except as noted in the HPI. Denies N/V/F/Ch.  Past Medical History:  Diagnosis Date  . Anal fissure   . Anemia   . History of gallstones   . Hypertension   . Obesity   . PCOS (polycystic ovarian syndrome)   . Perianal abscess   . PONV (postoperative nausea and vomiting)     Current Outpatient Medications:  .  ALPRAZolam (XANAX) 0.5 MG tablet, Take 0.5 mg by mouth at bedtime as needed for anxiety. , Disp: , Rfl:  .  Cholecalciferol (VITAMIN D3) 125 MCG (5000 UT) CAPS, Take by mouth daily., Disp: , Rfl:  .  Desoximetasone 0.05 % OINT, desoximetasone 0.05 % topical ointment  APPLY OINTMENT TOPICALLY TO AFFECTED AREA TWICE DAILY FOR 2 WEEKS AS NEEDED FOR FLARE, Disp: , Rfl:  .  hydrochlorothiazide (HYDRODIURIL) 25 MG tablet, Take 1 tablet (25 mg total) by mouth daily., Disp: 90 tablet, Rfl: 3 .  hydrOXYzine (VISTARIL) 25 MG capsule, Take 25 mg by mouth at bedtime., Disp: , Rfl:   Current Facility-Administered Medications:  .  0.9 %  sodium chloride infusion, 500  mL, Intravenous, Continuous, Danis, Estill Cotta III, MD  Social History   Tobacco Use  Smoking Status Never Smoker  Smokeless Tobacco Never Used    No Known Allergies Objective:  There were no vitals filed for this visit. There is no height or weight on file to calculate BMI. Constitutional Well developed. Well nourished.  Vascular Dorsalis pedis pulses palpable bilaterally. Posterior tibial pulses palpable bilaterally. Capillary refill normal to all digits.  No cyanosis or clubbing noted. Pedal hair growth normal.  Neurologic Normal speech. Oriented to person, place, and time. Epicritic sensation to light touch grossly present bilaterally.  Dermatologic Nails well groomed and normal in appearance. No open wounds. No skin lesions.  Orthopedic:  Pain on palpation right lateral foot.  Pain with dorsiflexion of his eversion resisted.  Pain at the fifth metatarsal base.  Mild pain along the shaft of the fifth metatarsal.  No pain with range of motion of the metatarsophalangeal joint.  No extensor or flexor tendinitis noted.   Radiographs: 3 views of skeletally mature adult right foot: No stress fracture noted no acute fracture noted.  No ossicles or signs of hypertrophy of the bone noted.  No other bony abnormalities identified. Assessment:   1. Peroneal tendinitis of right lower extremity  Plan:  Patient was evaluated and treated and all questions answered.  Right peroneal tendinitis versus fifth metatarsal stress fracture -I explained to the patient the etiology of tendinitis versus treatment options were discussed.  Given the location of her pain and clinically pain with resisted dorsiflexion eversion I believe that this likely includes peroneal tendinitis.  However it is in fact a stress fracture the treatment options for that is a cam boot immobilization which she will benefit for both etiologies.  I discussed this with the patient in extensive detail she states  understanding -Cam boot was dispensed.  No follow-ups on file.

## 2020-07-15 NOTE — Telephone Encounter (Signed)
Attempted to contact patient, 3rd attempt, but no answer and voicemail is full. I am unable to leave a message. I have sent a MyChart message to follow up with the patient as well. This was a follow up call regarding the surgery consultation from the 4/19 visit with Dr. Ulice Bold.

## 2020-08-07 ENCOUNTER — Ambulatory Visit: Payer: 59 | Admitting: Podiatry

## 2020-08-08 NOTE — Progress Notes (Deleted)
Office Visit Note  Patient: Denise Roth             Date of Birth: 1970-07-02           MRN: [id number]             PCP: Lynnda Child, MD Referring: Marva Panda, NP Visit Date: 08/22/2020 Occupation: @GUAROCC @  Subjective:  No chief complaint on file.   History of Present Illness: Denise Roth is a 50 y.o. female ***   Activities of Daily Living:  Patient reports morning stiffness for *** {minute/hour:19697}.   Patient {ACTIONS;DENIES/REPORTS:21021675::"Denies"} nocturnal pain.  Difficulty dressing/grooming: {ACTIONS;DENIES/REPORTS:21021675::"Denies"} Difficulty climbing stairs: {ACTIONS;DENIES/REPORTS:21021675::"Denies"} Difficulty getting out of chair: {ACTIONS;DENIES/REPORTS:21021675::"Denies"} Difficulty using hands for taps, buttons, cutlery, and/or writing: {ACTIONS;DENIES/REPORTS:21021675::"Denies"}  No Rheumatology ROS completed.   PMFS History:  Patient Active Problem List   Diagnosis Date Noted  . Anxiety 07/10/2020  . Encounter for counseling 06/24/2020  . Hot flashes 05/19/2020  . Insomnia disorder 10/30/2019  . Generalized anxiety disorder 10/30/2019  . Swelling of both ankles 08/17/2019  . History of gallstones 07/09/2019  . Vitamin D deficiency 07/09/2019  . Essential hypertension 07/09/2019  . Iron deficiency anemia 04/07/2016  . Chronic pain of right knee 03/23/2016  . Anal fissure s/p LLsphincterotomy 01/04/2012 11/22/2011  . Dyspepsia 11/22/2011  . Anemia 11/22/2011    Past Medical History:  Diagnosis Date  . Anal fissure   . Anemia   . History of gallstones   . Hypertension   . Obesity   . PCOS (polycystic ovarian syndrome)   . Perianal abscess   . PONV (postoperative nausea and vomiting)     Family History  Problem Relation Age of Onset  . Breast cancer Paternal Aunt   . Colon polyps Maternal Grandfather   . Diabetes Father   . Hypertension Father   . Heart disease Father   . Hypertension Mother   . Colon cancer Neg  Hx    Past Surgical History:  Procedure Laterality Date  . ANAL EXAMINATION UNDER ANESTHESIA  01/04/12   sphincterotomy by Dr. 01/06/12  . BREAST REDUCTION SURGERY    . CARPAL TUNNEL RELEASE     bilateral  . CHOLECYSTECTOMY    . EAR CYST EXCISION     left  . FLEXIBLE SIGMOIDOSCOPY  12/01/2011   Procedure: FLEXIBLE SIGMOIDOSCOPY;  Surgeon: 12/03/2011, MD;  Location: [facility];  Service: Endoscopy;  Laterality: N/A;  . GASTRIC BYPASS    . NASAL SEPTUM SURGERY    . TONSILLECTOMY    . TUBAL LIGATION    . tummy tuck     Social History   Social History Narrative  . Not on file   Immunization History  Administered Date(s) Administered  . Influenza, Quadrivalent, Recombinant, Inj, Pf 12/09/2018  . Influenza,inj,quad, With Preservative 01/03/2018, 12/09/2018  . PFIZER Comirnaty(Gray Top)Covid-19 Tri-Sucrose Vaccine 06/22/2019, 07/27/2019, 03/24/2020  . Tdap 03/22/2014     Objective: Vital Signs: There were no vitals taken for this visit.   Physical Exam   Musculoskeletal Exam: ***  CDAI Exam: CDAI Score: -- Patient Global: --; Provider Global: -- Swollen: --; Tender: -- Joint Exam 08/22/2020   No joint exam has been documented for this visit   There is currently no information documented on the homunculus. Go to the Rheumatology activity and complete the homunculus joint exam.  Investigation: No additional findings.  Imaging: DG Foot Complete Right  Result Date: 07/10/2020 Please see detailed radiograph report in office note.   Recent  Labs: Lab Results  Component Value Date   WBC 8.1 05/19/2020   HGB 11.8 (L) 05/19/2020   PLT 298.0 05/19/2020   NA 139 05/19/2020   K 4.4 05/19/2020   CL 102 05/19/2020   CO2 31 05/19/2020   GLUCOSE 73 05/19/2020   BUN 11 05/19/2020   CREATININE 0.57 05/19/2020   BILITOT 0.3 05/19/2020   ALKPHOS 86 05/19/2020   AST 18 05/19/2020   ALT 25 05/19/2020   PROT 6.8 05/19/2020   ALBUMIN 4.2 05/19/2020   CALCIUM 9.5  05/19/2020   GFRAA >60 04/20/2019    Speciality Comments: No specialty comments available.  Procedures:  No procedures performed Allergies: Patient has no known allergies.   Assessment / Plan:     Visit Diagnoses: No diagnosis found.  Orders: No orders of the defined types were placed in this encounter.  No orders of the defined types were placed in this encounter.   Face-to-face time spent with patient was *** minutes. Greater than 50% of time was spent in counseling and coordination of care.  Follow-Up Instructions: No follow-ups on file.   Ellen Henri, CMA  Note - This record has been created using Animal nutritionist.  Chart creation errors have been sought, but may not always  have been located. Such creation errors do not reflect on  the standard of medical care.

## 2020-08-22 ENCOUNTER — Ambulatory Visit: Payer: 59 | Admitting: Rheumatology

## 2020-08-22 DIAGNOSIS — Z862 Personal history of diseases of the blood and blood-forming organs and certain disorders involving the immune mechanism: Secondary | ICD-10-CM

## 2020-08-22 DIAGNOSIS — M5030 Other cervical disc degeneration, unspecified cervical region: Secondary | ICD-10-CM

## 2020-08-22 DIAGNOSIS — R7989 Other specified abnormal findings of blood chemistry: Secondary | ICD-10-CM

## 2020-08-22 DIAGNOSIS — E559 Vitamin D deficiency, unspecified: Secondary | ICD-10-CM

## 2020-08-22 DIAGNOSIS — M5136 Other intervertebral disc degeneration, lumbar region: Secondary | ICD-10-CM

## 2020-08-22 DIAGNOSIS — I10 Essential (primary) hypertension: Secondary | ICD-10-CM

## 2020-08-22 DIAGNOSIS — G8929 Other chronic pain: Secondary | ICD-10-CM

## 2020-08-22 DIAGNOSIS — Z8719 Personal history of other diseases of the digestive system: Secondary | ICD-10-CM

## 2020-08-22 DIAGNOSIS — E282 Polycystic ovarian syndrome: Secondary | ICD-10-CM

## 2020-08-28 ENCOUNTER — Other Ambulatory Visit: Payer: Self-pay

## 2020-08-28 ENCOUNTER — Encounter: Payer: Self-pay | Admitting: Family Medicine

## 2020-08-28 ENCOUNTER — Ambulatory Visit: Payer: 59 | Admitting: Family Medicine

## 2020-08-28 VITALS — BP 110/80 | HR 60 | Temp 98.2°F | Ht 63.5 in | Wt 176.0 lb

## 2020-08-28 DIAGNOSIS — M5412 Radiculopathy, cervical region: Secondary | ICD-10-CM

## 2020-08-28 DIAGNOSIS — M19019 Primary osteoarthritis, unspecified shoulder: Secondary | ICD-10-CM

## 2020-08-28 MED ORDER — PREDNISONE 20 MG PO TABS: ORAL_TABLET | ORAL | 0 refills | Status: DC

## 2020-08-28 MED ORDER — CYCLOBENZAPRINE HCL 10 MG PO TABS: 5.0000 mg | ORAL_TABLET | Freq: Every evening | ORAL | 1 refills | Status: DC | PRN

## 2020-08-28 NOTE — Progress Notes (Signed)
Kosisochukwu Burningham T. Gabrielle Wakeland, MD, CAQ Sports Medicine [facility] at [facility] [address] [place], [address]  Phone: [phone]  FAX: [phone]  Denise Roth - 50 y.o. female  MRN [id number]  Date of Birth: [birth year]  Date: 08/28/2020  PCP: Lynnda Child, MD  Referral: Lynnda Child, MD  Chief Complaint  Patient presents with   Neck Pain    Radiates to right shoulder and down arm    This visit occurred during the SARS-CoV-2 public health emergency.  Safety protocols were in place, including screening questions prior to the visit, additional usage of staff PPE, and extensive cleaning of exam room while observing appropriate contact time as indicated for disinfecting solutions.   Subjective:   Denise Roth is a 50 y.o. very pleasant female patient with Body mass index is 30.69 kg/m. who presents with the following:  R cervical rad Hurts with sleeping This is been ongoing for about 2 months.  Slowly has gotten worse. No treally so much neck pain, but she is having significant pain in the posterior shoulder and down the arm. She does describe some volar numbness as well as tingling.  No str changes No trauma or injury. No significant prior problems, and she has never had operative intervention.  She does not describe any pain with abduction, internal range of motion.  She is not really having any significant pain with contracting biceps or triceps.  Has tried ice, heating pad. Motrin, tylenol. Has done some self traction, and this has not helped.  L spurlings Some ac joint   Review of Systems is noted in the HPI, as appropriate   Objective:   BP 110/80   Pulse 60   Temp 98.2 F (36.8 C) (Temporal)   Ht 5' 3.5" (1.613 m)   Wt 176 lb (79.8 kg)   SpO2 98%   BMI 30.69 kg/m    GEN: alert,appropriate PSYCH: Normally interactive. Cooperative during the interview.   CERVICAL SPINE EXAM Range of motion: Flexion, extension,  lateral bending, and rotation: Approximate 20% loss in all directions Pain with terminal motion: Yes Spurling's is positive to the left.  This induces right-sided radiculopathy Spinous Processes: NT SCM: NT Upper paracervical muscles: Mildly tender. Upper traps: Mildly tender C5-T1 intact, sensation and motor with the exception: She does have some decreased sensation at the volar forearm to pinprick.   Shoulder: R Inspection: No muscle wasting or winging Ecchymosis/edema: neg  AC joint, scapula, clavicle: Modest tenderness at the [facility] joint Abduction: full, 5/5 Flexion: full, 5/5 IR, full, lift-off: 5/5 ER at neutral: full, 5/5 AC crossover and compression: Positive  Neer: neg Hawkins: neg Drop Test: neg Empty Can: neg Supraspinatus insertion: NT Bicipital groove: NT Speed's: neg Yergason's: neg Sulcus sign: neg Scapular dyskinesis: none C5-T1 intact Sensation intact Grip 5/5   Radiology: No results found.  Assessment and Plan:     ICD-10-CM   1. Right cervical radiculopathy  M54.12     2. AC joint arthropathy  M19.019      Exacerbation and worsening of neck pain.  Classic cervical radiculopathy with some volar decree sensation.  Strength is intact throughout.  Start McKenzie protocol.  If symptoms or not improving in 2 to 3 weeks then formal PT for assistance, consider cervical traction.  The failure of a number of different oral medications and modalities, and getting give her round of some steroids. Flexeril as needed at night.  We reviewed red flags as well.  Meds ordered this encounter  Medications   cyclobenzaprine (FLEXERIL) 10 MG tablet    Sig: Take 0.5-1 tablets (5-10 mg total) by mouth at bedtime as needed for muscle spasms.    Dispense:  30 tablet    Refill:  1   predniSONE (DELTASONE) 20 MG tablet    Sig: 2 tabs po for 7 days, then 1 tab po for 7 days    Dispense:  21 tablet    Refill:  0    Signed,  Janie Strothman T. Xochilth Standish, MD   Outpatient  Encounter Medications as of 08/28/2020  Medication Sig   ALPRAZolam (XANAX) 0.5 MG tablet Take 0.5 mg by mouth at bedtime as needed for anxiety.    Cholecalciferol (VITAMIN D3) 125 MCG (5000 UT) CAPS Take by mouth daily.   cyclobenzaprine (FLEXERIL) 10 MG tablet Take 0.5-1 tablets (5-10 mg total) by mouth at bedtime as needed for muscle spasms.   Desoximetasone 0.05 % OINT desoximetasone 0.05 % topical ointment  APPLY OINTMENT TOPICALLY TO AFFECTED AREA TWICE DAILY FOR 2 WEEKS AS NEEDED FOR FLARE   hydrochlorothiazide (HYDRODIURIL) 25 MG tablet Take 1 tablet (25 mg total) by mouth daily.   hydrOXYzine (VISTARIL) 25 MG capsule Take 25 mg by mouth at bedtime.   predniSONE (DELTASONE) 20 MG tablet 2 tabs po for 7 days, then 1 tab po for 7 days   Facility-Administered Encounter Medications as of 08/28/2020  Medication   0.9 %  sodium chloride infusion

## 2020-12-16 ENCOUNTER — Other Ambulatory Visit: Payer: Self-pay | Admitting: Family Medicine

## 2021-01-12 ENCOUNTER — Other Ambulatory Visit: Payer: Self-pay | Admitting: Family Medicine

## 2021-01-12 NOTE — Telephone Encounter (Signed)
Last office visit 08/28/2020 with Dr. Patsy Lager for neck pain.  Last refilled 12/17/2020 for #30 with no refills.  No future appointments.  Refill or send to PCP?

## 2021-01-12 NOTE — Telephone Encounter (Signed)
Ms. Plucinski notified as instructed by telephone.  She states she doesn't have her calendar with her so she will call back to schedule follow up appointment with Dr Patsy Lager.  Refill or Deny?

## 2021-01-12 NOTE — Telephone Encounter (Signed)
Can you check  Normally, I do not keep people on Flexeril chronically.  If she is still not doing well, I should recheck her in the office.

## 2021-02-05 HISTORY — PX: LIPOSUCTION: SHX10

## 2021-03-05 ENCOUNTER — Telehealth: Payer: 59 | Admitting: Nurse Practitioner

## 2021-03-10 ENCOUNTER — Telehealth: Payer: 59 | Admitting: Nurse Practitioner

## 2021-04-06 ENCOUNTER — Other Ambulatory Visit: Payer: Self-pay

## 2021-04-06 ENCOUNTER — Ambulatory Visit
Admission: EM | Admit: 2021-04-06 | Discharge: 2021-04-06 | Disposition: A | Discharge status: Home / Self Care | Payer: 59 | Attending: Physician Assistant | Admitting: Physician Assistant

## 2021-04-06 ENCOUNTER — Encounter: Payer: Self-pay | Admitting: Emergency Medicine

## 2021-04-06 DIAGNOSIS — M545 Low back pain, unspecified: Secondary | ICD-10-CM | POA: Diagnosis not present

## 2021-04-06 MED ORDER — DICLOFENAC SODIUM 75 MG PO TBEC: 75.0000 mg | DELAYED_RELEASE_TABLET | Freq: Two times a day (BID) | ORAL | 0 refills | Status: DC

## 2021-04-06 MED ORDER — METHOCARBAMOL 500 MG PO TABS: 500.0000 mg | ORAL_TABLET | Freq: Four times a day (QID) | ORAL | 0 refills | Status: DC

## 2021-04-06 MED ORDER — METHYLPREDNISOLONE SODIUM SUCC 125 MG IJ SOLR
125.0000 mg | Freq: Once | INTRAMUSCULAR | Status: AC
  Administered 2021-04-06: 125 mg via INTRAMUSCULAR

## 2021-04-06 MED ORDER — KETOROLAC TROMETHAMINE 60 MG/2ML IM SOLN
60.0000 mg | Freq: Once | INTRAMUSCULAR | Status: AC
  Administered 2021-04-06: 60 mg via INTRAMUSCULAR

## 2021-04-06 NOTE — ED Provider Notes (Signed)
[facility] Palau    CSN: [id number] Arrival date & time: 04/06/21  1314      History   Chief Complaint Chief Complaint  Patient presents with   Back Pain    HPI Denise Roth is a 51 y.o. female.   Pt reports she began having pain after lifting last night.  Pt reports similar pain in the past when she had a herniated disc.  Pt has an appointment with Orthopaedist on Thursday    The history is provided by the patient. No language interpreter was used.  Back Pain Location:  Lumbar spine Radiates to:  L thigh Pain severity:  Moderate Pain is:  Same all the time Onset quality:  Sudden Duration:  1 day Timing:  Constant Progression:  Worsening Chronicity:  New  Past Medical History:  Diagnosis Date   Anal fissure    Anemia    History of gallstones    Hypertension    Obesity    PCOS (polycystic ovarian syndrome)    Perianal abscess    PONV (postoperative nausea and vomiting)     Patient Active Problem List   Diagnosis Date Noted   Anxiety 07/10/2020   Encounter for counseling 06/24/2020   Hot flashes 05/19/2020   Insomnia disorder 10/30/2019   Generalized anxiety disorder 10/30/2019   Swelling of both ankles 08/17/2019   History of gallstones 07/09/2019   Vitamin D deficiency 07/09/2019   Essential hypertension 07/09/2019   Iron deficiency anemia 04/07/2016   Chronic pain of right knee 03/23/2016   Anal fissure s/p LLsphincterotomy 01/04/2012 11/22/2011   Dyspepsia 11/22/2011   Anemia 11/22/2011    Past Surgical History:  Procedure Laterality Date   ANAL EXAMINATION UNDER ANESTHESIA  01/04/12   sphincterotomy by Dr. Johney Maine   BREAST REDUCTION SURGERY     CARPAL TUNNEL RELEASE     bilateral   CHOLECYSTECTOMY     EAR CYST EXCISION     left   FLEXIBLE SIGMOIDOSCOPY  12/01/2011   Procedure: FLEXIBLE SIGMOIDOSCOPY;  Surgeon: Inda Castle, MD;  Location: [facility];  Service: Endoscopy;  Laterality: N/A;   GASTRIC BYPASS     NASAL SEPTUM SURGERY      TONSILLECTOMY     TUBAL LIGATION     tummy tuck      OB History   No obstetric history on file.      Home Medications    Prior to Admission medications   Medication Sig Start Date End Date Taking? Authorizing Provider  diclofenac (VOLTAREN) 75 MG EC tablet Take 1 tablet (75 mg total) by mouth 2 (two) times daily. 04/06/21  Yes Caryl Ada K, PA-C  methocarbamol (ROBAXIN) 500 MG tablet Take 1 tablet (500 mg total) by mouth 4 (four) times daily. 04/06/21  Yes Caryl Ada K, PA-C  ALPRAZolam Duanne Moron) 0.5 MG tablet Take 0.5 mg by mouth at bedtime as needed for anxiety.  10/11/19   [provider]  Cholecalciferol (VITAMIN D3) 125 MCG (5000 UT) CAPS Take by mouth daily.    [provider]  cyclobenzaprine (FLEXERIL) 10 MG tablet TAKE 1/2 TO 1 (ONE-HALF TO ONE) TABLET BY MOUTH AT BEDTIME AS NEEDED FOR MUSCLE SPASM 01/12/21   Copland, Frederico Hamman, MD  Desoximetasone 0.05 % OINT desoximetasone 0.05 % topical ointment  APPLY OINTMENT TOPICALLY TO AFFECTED AREA TWICE DAILY FOR 2 WEEKS AS NEEDED FOR FLARE    [provider]  hydrochlorothiazide (HYDRODIURIL) 25 MG tablet Take 1 tablet (25 mg total) by mouth daily.  05/19/20   Lesleigh Noe, MD  hydrOXYzine (VISTARIL) 25 MG capsule Take 25 mg by mouth at bedtime. 04/26/20   [provider]  predniSONE (DELTASONE) 20 MG tablet 2 tabs po for 7 days, then 1 tab po for 7 days 08/28/20   Owens Loffler, MD    Family History Family History  Problem Relation Age of Onset   Breast cancer Paternal Aunt    Colon polyps Maternal Grandfather    Diabetes Father    Hypertension Father    Heart disease Father    Hypertension Mother    Colon cancer Neg Hx     Social History Social History   Tobacco Use   Smoking status: Never   Smokeless tobacco: Never  Vaping Use   Vaping Use: Never used  Substance Use Topics   Alcohol use: Yes    Comment: holidays   Drug use: No     Allergies   Patient has no known  allergies.   Review of Systems Review of Systems  Musculoskeletal:  Positive for back pain.  All other systems reviewed and are negative.   Physical Exam Triage Vital Signs ED Triage Vitals  Enc Vitals Group     BP 04/06/21 1400 107/71     Pulse Rate 04/06/21 1400 75     Resp 04/06/21 1400 18     Temp 04/06/21 1400 98.7 F (37.1 C)     Temp Source 04/06/21 1400 Oral     SpO2 04/06/21 1400 96 %     Weight --      Height --      Head Circumference --      Peak Flow --      Pain Score 04/06/21 1408 8     Pain Loc --      Pain Edu? --      Excl. in [place]? --    No data found.  Updated Vital Signs BP 107/71 (BP Location: Left Arm)    Pulse 75    Temp 98.7 F (37.1 C) (Oral)    Resp 18    SpO2 96%   Visual Acuity Right Eye Distance:   Left Eye Distance:   Bilateral Distance:    Right Eye Near:   Left Eye Near:    Bilateral Near:     Physical Exam Vitals and nursing note reviewed.  Constitutional:      Appearance: She is well-developed.  HENT:     Head: Normocephalic.  Cardiovascular:     Rate and Rhythm: Normal rate.  Pulmonary:     Effort: Pulmonary effort is normal.  Abdominal:     General: There is no distension.  Musculoskeletal:        General: Tenderness present. Normal range of motion.     Cervical back: Normal range of motion.     Comments: Tender left lumbar spaine,  pain with movement  Skin:    General: Skin is warm.  Neurological:     General: No focal deficit present.     Mental Status: She is alert and oriented to person, place, and time.  Psychiatric:        Mood and Affect: Mood normal.     UC Treatments / Results  Labs (all labs ordered are listed, but only abnormal results are displayed) Labs Reviewed - No data to display  EKG   Radiology No results found.  Procedures Procedures (including critical care time)  Medications Ordered in UC Medications  methylPREDNISolone sodium succinate (SOLU-MEDROL)  125 mg/2 mL injection 125  mg (has no administration in time range)  ketorolac (TORADOL) injection 60 mg (has no administration in time range)    Initial Impression / Assessment and Plan / UC Course  I have reviewed the triage vital signs and the nursing notes.  Pertinent labs & imaging results that were available during my care of the patient were reviewed by me and considered in my medical decision making (see chart for details).     MDM:  Pt given solumedrol and toradol.  Rx for voltaren and robaxin    Final Clinical Impressions(s) / UC Diagnoses   Final diagnoses:  Acute left-sided low back pain without sciatica     Discharge Instructions      Follow up with the Orthopaedist as scheduled    ED Prescriptions     Medication Sig Dispense Auth. Provider   diclofenac (VOLTAREN) 75 MG EC tablet Take 1 tablet (75 mg total) by mouth 2 (two) times daily. 20 tablet Tifanny Dollens K, [place]   methocarbamol (ROBAXIN) 500 MG tablet Take 1 tablet (500 mg total) by mouth 4 (four) times daily. 40 tablet Fransico Meadow, [place]      PDMP not reviewed this encounter.   Fransico Meadow, [place] 04/06/21 1430

## 2021-04-06 NOTE — Discharge Instructions (Signed)
Follow up with the Orthopaedist as scheduled

## 2021-04-06 NOTE — ED Triage Notes (Addendum)
Pt here left lumbar back pain after kneeling down with a child last night and felt a sharp pain radiate from back to the hip to the lower abdomen and down the front of the left leg. Denies numbness. States it has gotten increasingly worse since then.

## 2021-05-14 LAB — HM MAMMOGRAPHY

## 2021-06-10 ENCOUNTER — Telehealth: Payer: Self-pay | Admitting: Family Medicine

## 2021-06-10 DIAGNOSIS — I10 Essential (primary) hypertension: Secondary | ICD-10-CM

## 2021-06-10 NOTE — Telephone Encounter (Signed)
Caller Name: Gailene Seal ?Call back phone #: [phone] ? ?MEDICATION(S): hydrochlorothiazide (HYDRODIURIL) 25 MG tablet ? ? ?Days of Med Remaining: 2 ? ?Has the patient contacted their pharmacy (YES/NO)?  yes ?IF YES, when and what did the pharmacy advise? Says it's expired ?IF NO, request that the patient contact the pharmacy for the refills in the future.  ?           The pharmacy will send an electronic request (except for controlled medications). ? ? ? ?~~~Please advise patient/caregiver to allow 2-3 business days to process RX refills. ? ?

## 2021-06-11 MED ORDER — HYDROCHLOROTHIAZIDE 25 MG PO TABS: 25.0000 mg | ORAL_TABLET | Freq: Every day | ORAL | 0 refills | Status: DC

## 2021-06-11 NOTE — Telephone Encounter (Signed)
Refill sent in

## 2021-08-18 ENCOUNTER — Ambulatory Visit: Payer: 59 | Admitting: Family Medicine

## 2021-08-18 ENCOUNTER — Encounter: Payer: Self-pay | Admitting: Family Medicine

## 2021-08-18 VITALS — BP 110/78 | HR 78 | Temp 98.6°F | Ht 63.5 in | Wt 168.0 lb

## 2021-08-18 DIAGNOSIS — F411 Generalized anxiety disorder: Secondary | ICD-10-CM | POA: Diagnosis not present

## 2021-08-18 DIAGNOSIS — I10 Essential (primary) hypertension: Secondary | ICD-10-CM

## 2021-08-18 DIAGNOSIS — Z13220 Encounter for screening for lipoid disorders: Secondary | ICD-10-CM | POA: Diagnosis not present

## 2021-08-18 DIAGNOSIS — E663 Overweight: Secondary | ICD-10-CM

## 2021-08-18 DIAGNOSIS — D649 Anemia, unspecified: Secondary | ICD-10-CM | POA: Diagnosis not present

## 2021-08-18 DIAGNOSIS — H60543 Acute eczematoid otitis externa, bilateral: Secondary | ICD-10-CM

## 2021-08-18 LAB — COMPREHENSIVE METABOLIC PANEL
ALT: 32 U/L (ref 0–35)
AST: 27 U/L (ref 0–37)
Albumin: 4.1 g/dL (ref 3.5–5.2)
Alkaline Phosphatase: 87 U/L (ref 39–117)
BUN: 10 mg/dL (ref 6–23)
CO2: 34 mEq/L — ABNORMAL HIGH (ref 19–32)
Calcium: 9.6 mg/dL (ref 8.4–10.5)
Chloride: 100 mEq/L (ref 96–112)
Creatinine, Ser: 0.62 mg/dL (ref 0.40–1.20)
GFR: 103.26 mL/min (ref >60.00)
Glucose, Bld: 85 mg/dL (ref 70–99)
Potassium: 3.6 mEq/L (ref 3.5–5.1)
Sodium: 141 mEq/L (ref 135–145)
Total Bilirubin: 0.3 mg/dL (ref 0.2–1.2)
Total Protein: 6.8 g/dL (ref 6.0–8.3)

## 2021-08-18 LAB — CBC
HCT: 36.8 % (ref 36.0–46.0)
Hemoglobin: 11.9 g/dL — ABNORMAL LOW (ref 12.0–15.0)
MCHC: 32.3 g/dL (ref 30.0–36.0)
MCV: 86.2 fl (ref 78.0–100.0)
Platelets: 276 10*3/uL (ref 150.0–400.0)
RBC: 4.26 Mil/uL (ref 3.87–5.11)
RDW: 13.4 % (ref 11.5–15.5)
WBC: 8.5 10*3/uL (ref 4.0–10.5)

## 2021-08-18 LAB — LIPID PANEL
Cholesterol: 148 mg/dL (ref 0–200)
HDL: 62.1 mg/dL (ref >39.00)
LDL Cholesterol: 69 mg/dL (ref 0–99)
NonHDL: 85.41
Total CHOL/HDL Ratio: 2
Triglycerides: 81 mg/dL (ref 0.0–149.0)
VLDL: 16.2 mg/dL (ref 0.0–40.0)

## 2021-08-18 LAB — FERRITIN: Ferritin: 266.2 ng/mL (ref 10.0–291.0)

## 2021-08-18 MED ORDER — HYDROCHLOROTHIAZIDE 25 MG PO TABS: 25.0000 mg | ORAL_TABLET | Freq: Every day | ORAL | 3 refills | Status: DC

## 2021-08-18 MED ORDER — ALPRAZOLAM 0.5 MG PO TABS: 0.5000 mg | ORAL_TABLET | Freq: Every evening | ORAL | 0 refills | Status: DC | PRN

## 2021-08-18 MED ORDER — TRIAMCINOLONE ACETONIDE 0.1 % EX CREA: 1.0000 "application " | TOPICAL_CREAM | Freq: Two times a day (BID) | CUTANEOUS | 0 refills | Status: DC

## 2021-08-18 MED ORDER — WEGOVY 0.25 MG/0.5ML ~~LOC~~ SOAJ: 0.2500 mg | SUBCUTANEOUS | 0 refills | Status: DC

## 2021-08-18 NOTE — Assessment & Plan Note (Signed)
Advised hydrocortisone trial.  If no improvement triamcinolone sent to pharmacy.

## 2021-08-18 NOTE — Progress Notes (Signed)
Subjective:     Denise Roth is a 51 y.o. female presenting for Annual Exam (Fasting )     HPI  #Itchy ears - not sure what is happening - just itchy - no drainage - not sure if dry skin - inside the ear canal - no hearing loss or pain - No itchy eyes, watery eyes, sinus drainage  #Weight concerns - exercising - walking 30 minutes per day - just recently had liposuction done in December - diet - work on [employer] for weight loss - healthy foods - Program every MTTh - has not lost weight with this - wondering about ozempic to get to the next step  #GAD - Taking xanax for Keidel anxiety - tried prozac but didn't like it - Xanax #30  Review of Systems   Social History   Tobacco Use  Smoking Status Never  Smokeless Tobacco Never        Objective:    BP Readings from Last 3 Encounters:  08/18/21 110/78  04/06/21 107/71  08/28/20 110/80   Wt Readings from Last 3 Encounters:  08/18/21 168 lb (76.2 kg)  08/28/20 176 lb (79.8 kg)  06/24/20 174 lb 9.6 oz (79.2 kg)    BP 110/78   Pulse 78   Temp 98.6 F (37 C) (Oral)   Ht 5' 3.5" (1.613 m)   Wt 168 lb (76.2 kg)   SpO2 98%   BMI 29.29 kg/m    Physical Exam Constitutional:      General: She is not in acute distress.    Appearance: She is well-developed. She is not diaphoretic.  HENT:     Head: Normocephalic and atraumatic.     Right Ear: Tympanic membrane normal.     Left Ear: Tympanic membrane normal.     Ears:     Comments: Dermatitis on the external opening to the ear canal    Nose: Nose normal.  Eyes:     Conjunctiva/sclera: Conjunctivae normal.  Cardiovascular:     Rate and Rhythm: Normal rate and regular rhythm.     Heart sounds: No murmur heard. Pulmonary:     Effort: Pulmonary effort is normal. No respiratory distress.     Breath sounds: Normal breath sounds. No wheezing.  Musculoskeletal:     Cervical back: Neck supple.  Skin:    General: Skin is warm and dry.     Capillary  Refill: Capillary refill takes less than 2 seconds.  Neurological:     Mental Status: She is alert. Mental status is at baseline.  Psychiatric:        Mood and Affect: Mood normal.        Behavior: Behavior normal.           Assessment & Plan:   Problem List Items Addressed This Visit       Cardiovascular and Mediastinum   Essential hypertension - Primary    BP at goal. Discussed option to stop HCTZ but pt also with leg swelling. Cont HCTZ 25 mg. Labs today      Relevant Medications   hydrochlorothiazide (HYDRODIURIL) 25 MG tablet   Other Relevant Orders   Comprehensive metabolic panel   CBC     Nervous and Auditory   Dermatitis of both ear canals    Advised hydrocortisone trial.  If no improvement triamcinolone sent to pharmacy.        Other   Anemia    No longer with menses. Last colonoscopy normal. Will repeat  and get ferritin if persistent anticipate FOBT.       Relevant Orders   Ferritin   Generalized anxiety disorder    Stable on prn xanax. Last fill 05/2020. #30 per year, return if worsening.       Relevant Medications   ALPRAZolam (XANAX) 0.5 MG tablet   Overweight (BMI 25.0-29.9)    C/b HTN. Pt interested in weight loss medication. With BMI >27 and HTN she is elligible. Discussed wegovy and will send in to see if this is covered. Return 3 months      Other Visit Diagnoses     Screening for hyperlipidemia       Relevant Orders   Lipid panel        Return in about 1 year (around 08/19/2022) for annual.  Lynnda Child, MD

## 2021-08-18 NOTE — Patient Instructions (Signed)
Itching ears - try hydrocortisone cream - if not working - prescription for triamcinolone  Weight - will see if wegovy is covered - if not consider healthy weight and wellness clinic   Anxiety - ok for as needed xanax up to 30 per year  Blood pressure - labs and continue medication

## 2021-08-18 NOTE — Assessment & Plan Note (Signed)
No longer with menses. Last colonoscopy normal. Will repeat and get ferritin if persistent anticipate FOBT.

## 2021-08-18 NOTE — Assessment & Plan Note (Signed)
BP at goal. Discussed option to stop HCTZ but pt also with leg swelling. Cont HCTZ 25 mg. Labs today

## 2021-08-18 NOTE — Assessment & Plan Note (Signed)
Stable on prn xanax. Last fill 05/2020. #30 per year, return if worsening.

## 2021-08-18 NOTE — Assessment & Plan Note (Signed)
C/b HTN. Pt interested in weight loss medication. With BMI >27 and HTN she is elligible. Discussed wegovy and will send in to see if this is covered. Return 3 months

## 2021-08-19 ENCOUNTER — Telehealth: Payer: Self-pay

## 2021-08-19 ENCOUNTER — Other Ambulatory Visit: Payer: Self-pay | Admitting: Family Medicine

## 2021-08-19 DIAGNOSIS — E663 Overweight: Secondary | ICD-10-CM

## 2021-08-19 DIAGNOSIS — D649 Anemia, unspecified: Secondary | ICD-10-CM

## 2021-08-19 NOTE — Telephone Encounter (Signed)
Prior auth started for [facility] 0.25MG /0.5ML auto-injectors. Nile Riggs Ybarbo ([employer]ula Listen) Rx #: [id number] Waiting for determination.

## 2021-08-21 NOTE — Telephone Encounter (Signed)
Prior auth for [employer] 0.25MG /0.5ML auto-injectors has been denied. Denise Roth (KeyJohna Sheriff) Rx #: [id number]  The request for coverage for WEGOVY INJ 0.25 MG, use as directed (2 per month), is denied.  This decision is based on health plan criteria for WEGOVY INJ 1.25 MG.  This medicine is covered only if:  All of the following: (1) The medication will be used as an adjunct to lifestyle modification (for example, dietary or caloric restriction, exercise, behavioral support, community-based program). (2) You have a body mass index greater than or equal to 27 kg/m2. The information provided does not show that you meet the criteria listed above.  Sent denial letter to scanning.

## 2021-08-24 NOTE — Addendum Note (Signed)
Addended by: Lynnda Child on: 08/24/2021 09:48 AM   Modules accepted: Orders

## 2021-08-26 ENCOUNTER — Encounter: Payer: Self-pay | Admitting: Family Medicine

## 2021-09-01 ENCOUNTER — Other Ambulatory Visit: Payer: Self-pay | Admitting: Family Medicine

## 2021-09-01 DIAGNOSIS — I10 Essential (primary) hypertension: Secondary | ICD-10-CM

## 2021-12-03 ENCOUNTER — Encounter (INDEPENDENT_AMBULATORY_CARE_PROVIDER_SITE_OTHER): Payer: Self-pay | Admitting: Internal Medicine

## 2021-12-03 ENCOUNTER — Ambulatory Visit (INDEPENDENT_AMBULATORY_CARE_PROVIDER_SITE_OTHER): Payer: 59 | Admitting: Internal Medicine

## 2021-12-03 VITALS — BP 102/71 | HR 96 | Temp 99.2°F | Ht 63.0 in | Wt 159.0 lb

## 2021-12-03 DIAGNOSIS — I10 Essential (primary) hypertension: Secondary | ICD-10-CM

## 2021-12-03 DIAGNOSIS — E663 Overweight: Secondary | ICD-10-CM

## 2021-12-03 DIAGNOSIS — Z0289 Encounter for other administrative examinations: Secondary | ICD-10-CM

## 2021-12-03 DIAGNOSIS — Z9884 Bariatric surgery status: Secondary | ICD-10-CM

## 2021-12-03 NOTE — Progress Notes (Signed)
  Office: [phone]  /  Fax: [phone]  Initial Visit  Denise Roth was seen in clinic today to evaluate for obesity. She is interested in losing weight to improve overall health and reduce the risk of weight related complications.  She is a very pleasant 51 year old female with history of gastric bypass with a presurgical weight of 266.  Procedure was performed in 2012.  She is not sure on exact procedure but recalls having a small pouch.  She is taking multivitamins.  She denies any symptoms of nutritional deficiencies.  She is interested in learning ways to maintain her weight.  She is now 159 pounds.  She is trying to eat healthy but may not be getting adequate amount of protein.  She is active.  Associated conditions include [place] blood pressure.  She is on hydrochlorothiazide.  She presents today to review program treatment options, initial physical assessment, and evaluation.      Past medical history includes:   Past Medical History:  Diagnosis Date   Anal fissure    Anemia    History of gallstones    Hypertension    Obesity    PCOS (polycystic ovarian syndrome)    Perianal abscess    PONV (postoperative nausea and vomiting)      Objective:   BP 102/71   Pulse 96   Temp 99.2 F (37.3 C)   Ht 5\' 3"  (1.6 m)   Wt 159 lb (72.1 kg)   SpO2 97%   BMI 28.17 kg/m  She was weighed on the bioimpedance scale:  Body mass index is 28.17 kg/m.  General:  Alert, oriented and cooperative. Patient is in no acute distress.  Respiratory: Normal respiratory effort, no problems with respiration noted  Extremities: Normal range of motion.    Mental Status: Normal mood and affect. Normal behavior. Normal judgment and thought content.   Assessment and Plan:  1. Overweight (BMI 25.0-29.9)  2. Hx of gastric bypass  3. Essential hypertension  Plan   Peak weight 266 pounds currently 159 status post gastric bypass in 2012.  Associated conditions include [place] blood pressure which  is currently well controlled on hydrochlorothiazide.  Patient has done remarkably well with weight loss maintenance and is taking multivitamins.  I am not sure what her anatomical configuration is but I would suggest checking vitamin levels to make sure there are no nutritional deficiencies.  These should include vitamin D, S34, folic acid, K, E, A, zinc, iron and copper.  She will review this with her primary care physician.  I counseled the patient on calculated BMR.  If her goal is to lose additional weight we advised on reduced calorie meal plan with an 80 g of protein goal which should help with appetite control, preservation of muscle mass and increased thermic effect of food.  Her blood pressure is adequately controlled there are no signs or symptoms of orthostasis.  She will continue on hydrochlorothiazide.   30 minutes was spent today on this visit including the above counseling, pre-visit chart review, and post-visit documentation.  Thomes Dinning, MD

## 2022-03-12 ENCOUNTER — Other Ambulatory Visit: Payer: Self-pay | Admitting: Endocrinology

## 2022-03-12 DIAGNOSIS — N911 Secondary amenorrhea: Secondary | ICD-10-CM

## 2022-04-13 ENCOUNTER — Ambulatory Visit
Admission: RE | Admit: 2022-04-13 | Discharge: 2022-04-13 | Disposition: A | Discharge status: Home / Self Care | Payer: 59 | Source: Ambulatory Visit | Attending: Endocrinology | Admitting: Endocrinology

## 2022-04-13 DIAGNOSIS — N911 Secondary amenorrhea: Secondary | ICD-10-CM

## 2022-04-13 MED ORDER — GADOPICLENOL 0.5 MMOL/ML IV SOLN: 7.5000 mL | Freq: Once | INTRAVENOUS | Status: DC | PRN

## 2022-04-13 MED ORDER — GADOPICLENOL 0.5 MMOL/ML IV SOLN
4.0000 mL | Freq: Once | INTRAVENOUS | Status: AC | PRN
  Administered 2022-04-13: 4 mL via INTRAVENOUS

## 2022-05-28 DIAGNOSIS — S73199A Other sprain of unspecified hip, initial encounter: Secondary | ICD-10-CM | POA: Insufficient documentation

## 2022-06-08 DIAGNOSIS — M25552 Pain in left hip: Secondary | ICD-10-CM | POA: Insufficient documentation

## 2022-07-12 DIAGNOSIS — M7061 Trochanteric bursitis, right hip: Secondary | ICD-10-CM | POA: Insufficient documentation

## 2022-10-22 LAB — HM PAP SMEAR: HPV, high-risk: NEGATIVE

## 2022-11-30 ENCOUNTER — Ambulatory Visit: Payer: 59 | Admitting: Family Medicine

## 2022-11-30 ENCOUNTER — Encounter: Payer: Self-pay | Admitting: Family Medicine

## 2022-11-30 VITALS — BP 126/74 | HR 95 | Temp 97.6°F | Ht 63.25 in | Wt 170.2 lb

## 2022-11-30 DIAGNOSIS — E663 Overweight: Secondary | ICD-10-CM | POA: Diagnosis not present

## 2022-11-30 DIAGNOSIS — I10 Essential (primary) hypertension: Secondary | ICD-10-CM | POA: Diagnosis not present

## 2022-11-30 DIAGNOSIS — F5104 Psychophysiologic insomnia: Secondary | ICD-10-CM | POA: Diagnosis not present

## 2022-11-30 DIAGNOSIS — H60543 Acute eczematoid otitis externa, bilateral: Secondary | ICD-10-CM | POA: Diagnosis not present

## 2022-11-30 DIAGNOSIS — M25471 Effusion, right ankle: Secondary | ICD-10-CM

## 2022-11-30 DIAGNOSIS — M25472 Effusion, left ankle: Secondary | ICD-10-CM

## 2022-11-30 LAB — LIPID PANEL
Cholesterol: 157 mg/dL (ref 0–200)
HDL: 63.1 mg/dL (ref >39.00)
LDL Cholesterol: 74 mg/dL (ref 0–99)
NonHDL: 93.62
Total CHOL/HDL Ratio: 2
Triglycerides: 99 mg/dL (ref 0.0–149.0)
VLDL: 19.8 mg/dL (ref 0.0–40.0)

## 2022-11-30 LAB — COMPREHENSIVE METABOLIC PANEL
ALT: 13 U/L (ref 0–35)
AST: 14 U/L (ref 0–37)
Albumin: 4.3 g/dL (ref 3.5–5.2)
Alkaline Phosphatase: 116 U/L (ref 39–117)
BUN: 10 mg/dL (ref 6–23)
CO2: 28 mEq/L (ref 19–32)
Calcium: 9.7 mg/dL (ref 8.4–10.5)
Chloride: 102 mEq/L (ref 96–112)
Creatinine, Ser: 0.74 mg/dL (ref 0.40–1.20)
GFR: 92.97 mL/min (ref >60.00)
Glucose, Bld: 147 mg/dL — ABNORMAL HIGH (ref 70–99)
Potassium: 3.7 mEq/L (ref 3.5–5.1)
Sodium: 141 mEq/L (ref 135–145)
Total Bilirubin: 0.2 mg/dL (ref 0.2–1.2)
Total Protein: 7 g/dL (ref 6.0–8.3)

## 2022-11-30 LAB — TSH: TSH: 0.83 u[IU]/mL (ref 0.35–5.50)

## 2022-11-30 LAB — CBC WITH DIFFERENTIAL/PLATELET
Basophils Absolute: 0.1 10*3/uL (ref 0.0–0.1)
Basophils Relative: 1 % (ref 0.0–3.0)
Eosinophils Absolute: 0.3 10*3/uL (ref 0.0–0.7)
Eosinophils Relative: 3.3 % (ref 0.0–5.0)
HCT: 40 % (ref 36.0–46.0)
Hemoglobin: 12.5 g/dL (ref 12.0–15.0)
Lymphocytes Relative: 20.1 % (ref 12.0–46.0)
Lymphs Abs: 1.9 10*3/uL (ref 0.7–4.0)
MCHC: 31.2 g/dL (ref 30.0–36.0)
MCV: 86.8 fl (ref 78.0–100.0)
Monocytes Absolute: 0.7 10*3/uL (ref 0.1–1.0)
Monocytes Relative: 7 % (ref 3.0–12.0)
Neutro Abs: 6.5 10*3/uL (ref 1.4–7.7)
Neutrophils Relative %: 68.6 % (ref 43.0–77.0)
Platelets: 337 10*3/uL (ref 150.0–400.0)
RBC: 4.6 Mil/uL (ref 3.87–5.11)
RDW: 14.5 % (ref 11.5–15.5)
WBC: 9.5 10*3/uL (ref 4.0–10.5)

## 2022-11-30 MED ORDER — HYDROCHLOROTHIAZIDE 25 MG PO TABS: 25.0000 mg | ORAL_TABLET | Freq: Every day | ORAL | 1 refills | Status: DC

## 2022-11-30 MED ORDER — HYDROXYZINE PAMOATE 25 MG PO CAPS: 25.0000 mg | ORAL_CAPSULE | Freq: Every evening | ORAL | 0 refills | Status: DC | PRN

## 2022-11-30 NOTE — Patient Instructions (Addendum)
I sent medicines to the pharmacy   The hydroxyzine is for sleep and also itching Caution of sedation   BP looks good  Take care of yourself   Labs today

## 2022-11-30 NOTE — Progress Notes (Signed)
Subjective:    Patient ID: Denise Roth, female    DOB: Feb 13, 1971, 52 y.o.   MRN: [id number]  HPI  Wt Readings from Last 3 Encounters:  11/30/22 170 lb 4 oz (77.2 kg)  12/03/21 159 lb (72.1 kg)  08/18/21 168 lb (76.2 kg)   29.92 kg/m  Vitals:   11/30/22 1358  BP: 126/74  Pulse: 95  Temp: 97.6 F (36.4 C)  SpO2: 97%   52 yo pt of Dr Selena Batten presents for medication refill for HTN    HTN bp is stable today  No cp or palpitations or headaches or edema  No side effects to medicines  BP Readings from Last 3 Encounters:  11/30/22 126/74  12/03/21 102/71  08/18/21 110/78    Takes hydrochlorothiazide 25 mg daily  Also helps with ankle swelling   Pulse Readings from Last 3 Encounters:  11/30/22 95  12/03/21 96  08/18/21 78     Pt thinks she was on lisinopril in distant past  ? Why she stopped it    Taking care of herself   Good eater and good with exercise      Lab Results  Component Value Date   NA 141 08/18/2021   K 3.6 08/18/2021   CO2 34 (H) 08/18/2021   GLUCOSE 85 08/18/2021   BUN 10 08/18/2021   CREATININE 0.62 08/18/2021   CALCIUM 9.6 08/18/2021   GFR 103.26 08/18/2021   GFRNONAA >60 04/20/2019    Also needs refill for hydroxyzine 25 mg  For itching  Eczema  Has dry skin in ears    No issues with cholesterol in past    Patient Active Problem List   Diagnosis Date Noted   Dermatitis of both ear canals 08/18/2021   Overweight (BMI 25.0-29.9) 08/18/2021   Anxiety 07/10/2020   Encounter for counseling 06/24/2020   Hot flashes 05/19/2020   Insomnia disorder 10/30/2019   Generalized anxiety disorder 10/30/2019   Swelling of both ankles 08/17/2019   History of gallstones 07/09/2019   Vitamin D deficiency 07/09/2019   Essential hypertension 07/09/2019   Iron deficiency anemia 04/07/2016   Chronic pain of right knee 03/23/2016   Anal fissure s/p LLsphincterotomy 01/04/2012 11/22/2011   Dyspepsia 11/22/2011   Anemia 11/22/2011    Past Medical History:  Diagnosis Date   Anal fissure    Anemia    History of gallstones    Hypertension    Obesity    PCOS (polycystic ovarian syndrome)    Perianal abscess    PONV (postoperative nausea and vomiting)    Past Surgical History:  Procedure Laterality Date   ANAL EXAMINATION UNDER ANESTHESIA  01/04/2012   sphincterotomy by Dr. Michaell Cowing   BREAST REDUCTION SURGERY     CARPAL TUNNEL RELEASE     bilateral   CHOLECYSTECTOMY     EAR CYST EXCISION     left   FLEXIBLE SIGMOIDOSCOPY  12/01/2011   Procedure: FLEXIBLE SIGMOIDOSCOPY;  Surgeon: Louis Meckel, MD;  Location: [facility];  Service: Endoscopy;  Laterality: N/A;   GASTRIC BYPASS     LIPOSUCTION  02/2021   NASAL SEPTUM SURGERY     TONSILLECTOMY     TUBAL LIGATION     tummy tuck     Social History   Tobacco Use   Smoking status: Never   Smokeless tobacco: Never  Vaping Use   Vaping status: Never Used  Substance Use Topics   Alcohol use: Yes    Comment: holidays   Drug  use: No   Family History  Problem Relation Age of Onset   Breast cancer Paternal Aunt    Colon polyps Maternal Grandfather    Diabetes Father    Hypertension Father    Heart disease Father    Hypertension Mother    Colon cancer Neg Hx    No Known Allergies No current outpatient medications on file prior to visit.   No current facility-administered medications on file prior to visit.    Review of Systems  Constitutional:  Negative for activity change, appetite change, fatigue, fever and unexpected weight change.  HENT:  Negative for congestion, ear pain, rhinorrhea, sinus pressure and sore throat.   Eyes:  Negative for pain, redness and visual disturbance.  Respiratory:  Negative for cough, shortness of breath and wheezing.   Cardiovascular:  Negative for chest pain and palpitations.  Gastrointestinal:  Negative for abdominal pain, blood in stool, constipation and diarrhea.  Endocrine: Negative for polydipsia and polyuria.        Some menopause symptoms   Genitourinary:  Negative for dysuria, frequency and urgency.  Musculoskeletal:  Negative for arthralgias, back pain and myalgias.  Skin:  Negative for pallor and rash.       Eczema  In ears   Allergic/Immunologic: Negative for environmental allergies.  Neurological:  Negative for dizziness, syncope and headaches.  Hematological:  Negative for adenopathy. Does not bruise/bleed easily.  Psychiatric/Behavioral:  Positive for sleep disturbance. Negative for decreased concentration and dysphoric mood. The patient is not nervous/anxious.        Objective:   Physical Exam Constitutional:      General: She is not in acute distress.    Appearance: Normal appearance. She is well-developed. She is obese. She is not ill-appearing or diaphoretic.  HENT:     Head: Normocephalic and atraumatic.     Mouth/Throat:     Mouth: Mucous membranes are moist.  Eyes:     General: No scleral icterus.    Conjunctiva/sclera: Conjunctivae normal.     Pupils: Pupils are equal, round, and reactive to light.  Neck:     Thyroid: No thyromegaly.     Vascular: No carotid bruit or JVD.  Cardiovascular:     Rate and Rhythm: Normal rate and regular rhythm.     Heart sounds: Normal heart sounds.     No gallop.  Pulmonary:     Effort: Pulmonary effort is normal. No respiratory distress.     Breath sounds: Normal breath sounds. No wheezing or rales.  Abdominal:     General: There is no distension or abdominal bruit.     Palpations: Abdomen is soft.  Musculoskeletal:     Cervical back: Normal range of motion and neck supple.     Right lower leg: No edema.     Left lower leg: No edema.  Lymphadenopathy:     Cervical: No cervical adenopathy.  Skin:    General: Skin is warm and dry.     Coloration: Skin is not pale.     Findings: No rash.  Neurological:     Mental Status: She is alert.     Coordination: Coordination normal.     Deep Tendon Reflexes: Reflexes are normal and  symmetric. Reflexes normal.  Psychiatric:        Mood and Affect: Mood normal.           Assessment & Plan:   Problem List Items Addressed This Visit       Cardiovascular and Mediastinum  Essential hypertension - Primary    bp in fair control at this time  BP Readings from Last 1 Encounters:  11/30/22 126/74   No changes needed Continues hydrochlorothiazide 25 mg daily (this also helps pedal edema)  Took lisinopril in distant past  Most recent labs reviewed  Disc lifstyle change with low sodium diet and exercise  Labs today      Relevant Medications   hydrochlorothiazide (HYDRODIURIL) 25 MG tablet   Other Relevant Orders   TSH   Lipid panel   Comprehensive metabolic panel   CBC with Differential/Platelet     Nervous and Auditory   Dermatitis of both ear canals    Has seen ENT Used topicals in past  Hydroxyzine helps itch occational as well        Other   Insomnia disorder    Hydroxyzine 25 mg is helpful  Does not use every night  Encouraged caution of sedation  Refilled today      Overweight (BMI 25.0-29.9)   Swelling of both ankles    Hydrochlorothiazide is helpful

## 2022-11-30 NOTE — Assessment & Plan Note (Signed)
Has seen ENT Used topicals in past  Hydroxyzine helps itch occational as well

## 2022-11-30 NOTE — Assessment & Plan Note (Signed)
bp in fair control at this time  BP Readings from Last 1 Encounters:  11/30/22 126/74   No changes needed Continues hydrochlorothiazide 25 mg daily (this also helps pedal edema)  Took lisinopril in distant past  Most recent labs reviewed  Disc lifstyle change with low sodium diet and exercise  Labs today

## 2022-11-30 NOTE — Assessment & Plan Note (Signed)
Hydrochlorothiazide is helpful

## 2022-11-30 NOTE — Assessment & Plan Note (Signed)
Hydroxyzine 25 mg is helpful  Does not use every night  Encouraged caution of sedation  Refilled today

## 2022-12-01 ENCOUNTER — Encounter: Payer: Self-pay | Admitting: *Deleted

## 2022-12-15 ENCOUNTER — Ambulatory Visit (INDEPENDENT_AMBULATORY_CARE_PROVIDER_SITE_OTHER): Payer: 59 | Admitting: Otolaryngology

## 2023-02-24 ENCOUNTER — Other Ambulatory Visit: Payer: Self-pay | Admitting: Family Medicine

## 2023-02-24 NOTE — Telephone Encounter (Signed)
Pt had med refilled with Dr. Milinda Antis on 11/30/22, pt has TOC appt with Dr. Milinda Antis scheduled on 04/17/22  Last filled on 11/30/22 #90 caps/ 0 refills

## 2023-04-18 ENCOUNTER — Ambulatory Visit: Payer: 59 | Admitting: Family Medicine

## 2023-04-18 ENCOUNTER — Encounter: Payer: Self-pay | Admitting: Family Medicine

## 2023-04-18 VITALS — BP 106/68 | HR 81 | Temp 99.7°F | Ht 63.25 in | Wt 169.1 lb

## 2023-04-18 DIAGNOSIS — J0100 Acute maxillary sinusitis, unspecified: Secondary | ICD-10-CM | POA: Diagnosis not present

## 2023-04-18 DIAGNOSIS — I10 Essential (primary) hypertension: Secondary | ICD-10-CM | POA: Diagnosis not present

## 2023-04-18 DIAGNOSIS — K219 Gastro-esophageal reflux disease without esophagitis: Secondary | ICD-10-CM | POA: Diagnosis not present

## 2023-04-18 DIAGNOSIS — K5904 Chronic idiopathic constipation: Secondary | ICD-10-CM | POA: Diagnosis not present

## 2023-04-18 DIAGNOSIS — J019 Acute sinusitis, unspecified: Secondary | ICD-10-CM | POA: Insufficient documentation

## 2023-04-18 DIAGNOSIS — K5900 Constipation, unspecified: Secondary | ICD-10-CM | POA: Insufficient documentation

## 2023-04-18 MED ORDER — OMEPRAZOLE 20 MG PO CPDR: 20.0000 mg | DELAYED_RELEASE_CAPSULE | Freq: Every day | ORAL | 1 refills | Status: AC

## 2023-04-18 MED ORDER — AMOXICILLIN-POT CLAVULANATE 875-125 MG PO TABS: 1.0000 | ORAL_TABLET | Freq: Two times a day (BID) | ORAL | 0 refills | Status: DC

## 2023-04-18 NOTE — Patient Instructions (Addendum)
 Get mirlax over the counter and use 1-3 times daily until bowels are more regular and then settle on what works for you  Get the store brand   Stay hydrated Eat your fruits and veggies   Avoid your acid reflux triggers     I will send in omeprazole  20 mg once daily  It this does not continue to help let us  know    Take augmentin  for sinus infection  Try some nasal saline spray or netti pot for congestion  Steam and vapor helps also  Update if not starting to improve in a week or if worsening   Schedule annual exam this summer

## 2023-04-18 NOTE — Assessment & Plan Note (Signed)
 bp in fair control at this time  BP Readings from Last 1 Encounters:  04/18/23 106/68   No changes needed Continues hydrochlorothiazide  25 mg daily (this also helps pedal edema)  Took lisinopril in distant past  Most recent labs reviewed

## 2023-04-18 NOTE — Assessment & Plan Note (Signed)
 Nasal congestion for 3 weeks Now facial pressure/ low grade temp (today) If any mucous - can have color and some blood  Will treat with augmentin  for possible bacterial infection  Nasal saline may be helpful along with steam/vapor  Update if not starting to improve in a week or if worsening  Call back and Er precautions noted in detail today   Handout given

## 2023-04-18 NOTE — Progress Notes (Signed)
 Subjective:    Patient ID: Denise Roth, female    DOB: Jul 28, 1970, 53 y.o.   MRN: [id number]  HPI  Wt Readings from Last 3 Encounters:  04/18/23 169 lb 2 oz (76.7 kg)  11/30/22 170 lb 4 oz (77.2 kg)  12/03/21 159 lb (72.1 kg)   29.72 kg/m  Vitals:   04/18/23 1527  BP: 106/68  Pulse: 81  Temp: 99.7 F (37.6 C)  SpO2: 97%   Pt presents to transfer care from Dr Aletha Hutching  Nasal congestion for 3 weeks Cannot blow mucous out / feels dry  A little blood occasionally  Feels like sinuses and head are full  When she does get mucous -can by yellow Right eye is runny also  Right ear pressure  A little cough from sinus drainage  Headaches  Low grade temp today    Some constipation and GERD   Constipation  Ongoing for a long time  Sometimes takes stool softener Probiotic used to help/not lately  Took linzess  years ago      Occational upper abdomen burns a bit  Started back taking some over the counter prilosec after walking up with acid reflux  ? If taking 10 or 20  About a month  Helps some   Colonoscopy normal in 2018 Dr Dominic Friendly    Past history of gastric bypass surgery - in 2012  Also tummy tuck   Dyspepsia is on med list   HTN bp is stable today  No cp or palpitations or headaches or edema  No side effects to medicines  BP Readings from Last 3 Encounters:  04/18/23 106/68  11/30/22 126/74  12/03/21 102/71    Takes hydrochlorothiazide  25 mg daily for blood pressure and swelling   Lab Results  Component Value Date   NA 141 11/30/2022   K 3.7 11/30/2022   CO2 28 11/30/2022   GLUCOSE 147 (H) 11/30/2022   BUN 10 11/30/2022   CREATININE 0.74 11/30/2022   CALCIUM 9.7 11/30/2022   GFR 92.97 11/30/2022   GFRNONAA >60 04/20/2019      Patient Active Problem List   Diagnosis Date Noted   Acute sinus infection 04/18/2023   Constipation 04/18/2023   Dermatitis of both ear canals 08/18/2021   Overweight (BMI 25.0-29.9) 08/18/2021   Anxiety 07/10/2020    Encounter for counseling 06/24/2020   Hot flashes 05/19/2020   Insomnia disorder 10/30/2019   Generalized anxiety disorder 10/30/2019   Swelling of both ankles 08/17/2019   History of gallstones 07/09/2019   Vitamin D deficiency 07/09/2019   Essential hypertension 07/09/2019   Iron deficiency anemia 04/07/2016   Chronic pain of right knee 03/23/2016   Anal fissure s/p LLsphincterotomy 01/04/2012 11/22/2011   GERD (gastroesophageal reflux disease) 11/22/2011   Anemia 11/22/2011   Past Medical History:  Diagnosis Date   Anal fissure    Anemia    History of gallstones    Hypertension    Obesity    PCOS (polycystic ovarian syndrome)    Perianal abscess    PONV (postoperative nausea and vomiting)    Past Surgical History:  Procedure Laterality Date   ANAL EXAMINATION UNDER ANESTHESIA  01/04/2012   sphincterotomy by Dr. Hershell Lose   BREAST REDUCTION SURGERY     CARPAL TUNNEL RELEASE     bilateral   CHOLECYSTECTOMY     EAR CYST EXCISION     left   FLEXIBLE SIGMOIDOSCOPY  12/01/2011   Procedure: FLEXIBLE SIGMOIDOSCOPY;  Surgeon: Claudette Cue, MD;  Location:  [facility];  Service: Endoscopy;  Laterality: N/A;   GASTRIC BYPASS     LIPOSUCTION  02/2021   NASAL SEPTUM SURGERY     TONSILLECTOMY     TUBAL LIGATION     tummy tuck     Social History   Tobacco Use   Smoking status: Never   Smokeless tobacco: Never  Vaping Use   Vaping status: Never Used  Substance Use Topics   Alcohol use: Yes    Comment: holidays   Drug use: No   Family History  Problem Relation Age of Onset   Breast cancer Paternal Aunt    Colon polyps Maternal Grandfather    Diabetes Father    Hypertension Father    Heart disease Father    Hypertension Mother    Colon cancer Neg Hx    No Known Allergies Current Outpatient Medications on File Prior to Visit  Medication Sig Dispense Refill   hydrochlorothiazide  (HYDRODIURIL ) 25 MG tablet Take 1 tablet (25 mg total) by mouth daily. Appt with PCP  needed for further refills. 90 tablet 1   hydrOXYzine  (VISTARIL ) 25 MG capsule TAKE 1 CAPSULE BY MOUTH AT BEDTIME AS NEEDED FOR ANXIETY (INSOMNIA)  OR  ITCHING 90 capsule 1   Meloxicam 15 MG TBDP Take 1 capsule by mouth daily.     No current facility-administered medications on file prior to visit.    Review of Systems  Constitutional:  Negative for appetite change, fatigue and fever.  HENT:  Positive for congestion, ear pain, postnasal drip, rhinorrhea, sinus pressure and sore throat. Negative for nosebleeds.   Eyes:  Negative for pain, redness and itching.  Respiratory:  Positive for cough. Negative for shortness of breath and wheezing.   Cardiovascular:  Negative for chest pain.  Gastrointestinal:  Positive for constipation. Negative for abdominal pain, blood in stool, diarrhea, nausea and vomiting.       Occational heartburn- at night  Occational epigastric burning   Endocrine: Negative for polyuria.  Genitourinary:  Negative for dysuria, frequency and urgency.  Musculoskeletal:  Negative for arthralgias and myalgias.  Allergic/Immunologic: Negative for immunocompromised state.  Neurological:  Positive for headaches. Negative for dizziness, tremors, syncope, weakness and numbness.  Hematological:  Negative for adenopathy. Does not bruise/bleed easily.  Psychiatric/Behavioral:  Negative for dysphoric mood. The patient is not nervous/anxious.        Objective:   Physical Exam Constitutional:      General: She is not in acute distress.    Appearance: Normal appearance. She is well-developed. She is not ill-appearing.     Comments: Overweight   HENT:     Head: Normocephalic and atraumatic.     Comments: No sinus tenderness Pt feels pressure however with palp around eyes   No facial swelling    Right Ear: Tympanic membrane, ear canal and external ear normal.     Left Ear: Tympanic membrane, ear canal and external ear normal.     Nose: Congestion and rhinorrhea present.      Mouth/Throat:     Pharynx: Oropharynx is clear. No oropharyngeal exudate or posterior oropharyngeal erythema.     Comments: Clear pnd Eyes:     General:        Right eye: No discharge.        Left eye: No discharge.     Conjunctiva/sclera: Conjunctivae normal.     Pupils: Pupils are equal, round, and reactive to light.  Cardiovascular:     Rate and Rhythm: Normal rate and regular  rhythm.  Pulmonary:     Effort: Pulmonary effort is normal. No respiratory distress.     Breath sounds: Normal breath sounds. No stridor. No wheezing, rhonchi or rales.     Comments: Good air exch No rales or rhonchi Abdominal:     General: Abdomen is flat. Bowel sounds are normal.     Palpations: There is no hepatomegaly, splenomegaly, mass or pulsatile mass.     Tenderness: There is no abdominal tenderness.  Musculoskeletal:     Cervical back: Normal range of motion and neck supple.  Lymphadenopathy:     Cervical: No cervical adenopathy.  Skin:    General: Skin is warm and dry.     Coloration: Skin is not jaundiced.     Findings: No bruising or rash.  Neurological:     Mental Status: She is alert.     Cranial Nerves: No cranial nerve deficit.     Coordination: Coordination normal.  Psychiatric:        Mood and Affect: Mood normal.           Assessment & Plan:   Problem List Items Addressed This Visit       Cardiovascular and Mediastinum   Essential hypertension   bp in fair control at this time  BP Readings from Last 1 Encounters:  04/18/23 106/68   No changes needed Continues hydrochlorothiazide  25 mg daily (this also helps pedal edema)  Took lisinopril in distant past  Most recent labs reviewed          Respiratory   Acute sinus infection - Primary   Nasal congestion for 3 weeks Now facial pressure/ low grade temp (today) If any mucous - can have color and some blood  Will treat with augmentin  for possible bacterial infection  Nasal saline may be helpful along with  steam/vapor  Update if not starting to improve in a week or if worsening  Call back and Er precautions noted in detail today   Handout given       Relevant Medications   amoxicillin -clavulanate (AUGMENTIN ) 875-125 MG tablet     Digestive   GERD (gastroesophageal reflux disease)   In pt with history of gastric bypass surgery   At times epigastric burning  Occational heartburn  Pt started over the counter omeprazole  with some improvement (not sure what dose) Sent in omep 20 mg daily   Encouraged to avoid triggers and handout given  Goal to discuss weaning this in future   Will follow up for annual visit this summer       Relevant Medications   omeprazole  (PRILOSEC) 20 MG capsule     Other   Constipation   Acute on chronic in pt with history of gastric bypass in 2012  No pain or n/v  Does eat produce and stays hydrated (does take hydrochlorothiazide ) Recently started back on omeprazole  for GERD-this may worsen condition   Colonoscopy reviewed 2018 Dr Danis/normal   Instructed to try miralax 1-3 times per day and titrate to need  Update if worse or not improving  Reassuring exam today

## 2023-04-18 NOTE — Assessment & Plan Note (Signed)
 Acute on chronic in pt with history of gastric bypass in 2012  No pain or n/v  Does eat produce and stays hydrated (does take hydrochlorothiazide ) Recently started back on omeprazole  for GERD-this may worsen condition   Colonoscopy reviewed 2018 Dr Danis/normal   Instructed to try miralax 1-3 times per day and titrate to need  Update if worse or not improving  Reassuring exam today

## 2023-04-18 NOTE — Assessment & Plan Note (Signed)
 In pt with history of gastric bypass surgery   At times epigastric burning  Occational heartburn  Pt started over the counter omeprazole  with some improvement (not sure what dose) Sent in omep 20 mg daily   Encouraged to avoid triggers and handout given  Goal to discuss weaning this in future   Will follow up for annual visit this summer

## 2023-04-29 ENCOUNTER — Encounter: Payer: Self-pay | Admitting: Obstetrics and Gynecology

## 2023-05-26 ENCOUNTER — Encounter: Payer: Self-pay | Admitting: Podiatry

## 2023-05-26 ENCOUNTER — Ambulatory Visit: Admitting: Podiatry

## 2023-05-26 DIAGNOSIS — B351 Tinea unguium: Secondary | ICD-10-CM

## 2023-05-26 DIAGNOSIS — Q666 Other congenital valgus deformities of feet: Secondary | ICD-10-CM | POA: Diagnosis not present

## 2023-05-26 DIAGNOSIS — Z79899 Other long term (current) drug therapy: Secondary | ICD-10-CM

## 2023-05-26 DIAGNOSIS — E236 Other disorders of pituitary gland: Secondary | ICD-10-CM | POA: Insufficient documentation

## 2023-05-26 MED ORDER — CLOTRIMAZOLE-BETAMETHASONE 1-0.05 % EX CREA: 1.0000 | TOPICAL_CREAM | Freq: Every day | CUTANEOUS | 0 refills | Status: DC

## 2023-05-26 NOTE — Progress Notes (Signed)
 Subjective:  Patient ID: Denise Roth, female    DOB: [birth year],  MRN: [id number]  Chief Complaint  Patient presents with   Nail Problem    Pt stated that her nails are discolored and she has some discomfort on the outside of her left foot     53 y.o. female presents with the above complaint.  Patient presents with thickened onychodystrophy mycotic nail left hallux third and fourth digit.  Patient states been present for quite some time is progressive gotten worse worse with ambulation worse with pressure she would like to discuss treatment options for this she has not seen MRIs prior to seeing me.  She has not tried Lamisil.  She has secondary complaint of lateral foot pain likely due to wearing dress shoes.  She has semiflexible flatfoot she does not wear any orthotics she does not wear good shoe brand   Review of Systems: Negative except as noted in the HPI. Denies N/V/F/Ch.  Past Medical History:  Diagnosis Date   Anal fissure    Anemia    History of gallstones    Hypertension    Obesity    PCOS (polycystic ovarian syndrome)    Perianal abscess    PONV (postoperative nausea and vomiting)     Current Outpatient Medications:    clotrimazole-betamethasone (LOTRISONE) cream, Apply 1 Application topically daily., Disp: 30 g, Rfl: 0   amoxicillin-clavulanate (AUGMENTIN) 875-125 MG tablet, Take 1 tablet by mouth 2 (two) times daily., Disp: 14 tablet, Rfl: 0   hydrochlorothiazide (HYDRODIURIL) 25 MG tablet, Take 1 tablet (25 mg total) by mouth daily. Appt with PCP needed for further refills., Disp: 90 tablet, Rfl: 1   hydrOXYzine (VISTARIL) 25 MG capsule, TAKE 1 CAPSULE BY MOUTH AT BEDTIME AS NEEDED FOR ANXIETY (INSOMNIA)  OR  ITCHING, Disp: 90 capsule, Rfl: 1   Meloxicam 15 MG TBDP, Take 1 capsule by mouth daily., Disp: , Rfl:    omeprazole (PRILOSEC) 20 MG capsule, Take 1 capsule (20 mg total) by mouth daily., Disp: 90 capsule, Rfl: 1  Social History   Tobacco Use  Smoking  Status Never  Smokeless Tobacco Never    No Known Allergies Objective:  There were no vitals filed for this visit. There is no height or weight on file to calculate BMI. Constitutional Well developed. Well nourished.  Vascular Dorsalis pedis pulses palpable bilaterally. Posterior tibial pulses palpable bilaterally. Capillary refill normal to all digits.  No cyanosis or clubbing noted. Pedal hair growth normal.  Neurologic Normal speech. Oriented to person, place, and time. Epicritic sensation to light touch grossly present bilaterally.  Dermatologic Nails thickened onychodystrophy mycotic nail left hallux third fourth digit Skin within normal limits  Orthopedic: Normal joint ROM without pain or crepitus bilaterally. No visible deformities. No bony tenderness.   Radiographs: None Assessment:   1. Encounter for long-term (current) use of Eanes-risk medication   2. Nail fungus   3. Onychomycosis due to dermatophyte   4. Pes planovalgus    Plan:  Patient was evaluated and treated and all questions answered.  Left onychomycosis/nail fungus to left hallux fourth and third digit -Educated the patient on the etiology of onychomycosis and various treatment options associated with improving the fungal load.  I explained to the patient that there is 3 treatment options available to treat the onychomycosis including topical, p.o., laser treatment.  Patient elected to undergo p.o. options with Lamisil/terbinafine therapy.  In order for me to start the medication therapy, I explained to the patient  the importance of evaluating the liver and obtaining the liver function test.  Once the liver function test comes back normal I will start him on 66-month course of Lamisil therapy.  Patient understood all risk and would like to proceed with Lamisil therapy.  I have asked the patient to immediately stop the Lamisil therapy if she has any reactions to it and call the office or go to the emergency room  right away.  Patient states understanding  Pes planovalgus -I explained to patient the etiology of pes planovalgus and relationship with Planter fasciitis and various treatment options were discussed.  Given patient foot structure in the setting of Planter fasciitis I believe patient will benefit from custom-made orthotics to help control the hindfoot motion support the arch of the foot and take the stress away from plantar fascial.  Patient agrees with the plan like to proceed with orthotics -Patient was casted for orthotics   No follow-ups on file.

## 2023-05-27 LAB — HEPATIC FUNCTION PANEL
ALT: 16 IU/L (ref 0–32)
AST: 15 IU/L (ref 0–40)
Albumin: 4.3 g/dL (ref 3.8–4.9)
Alkaline Phosphatase: 107 IU/L (ref 44–121)
Bilirubin Total: <0.2 mg/dL (ref 0.0–1.2)
Bilirubin, Direct: 0.08 mg/dL (ref 0.00–0.40)
Total Protein: 6.7 g/dL (ref 6.0–8.5)

## 2023-05-27 LAB — HM MAMMOGRAPHY

## 2023-05-27 MED ORDER — TERBINAFINE HCL 250 MG PO TABS: 250.0000 mg | ORAL_TABLET | Freq: Every day | ORAL | 0 refills | Status: DC

## 2023-05-27 NOTE — Addendum Note (Signed)
 Addended by: [name] on: 05/27/2023 10:50 AM   Modules accepted: Orders

## 2023-06-02 NOTE — Progress Notes (Signed)
  Orthotic order placed will schedule for fitting when in

## 2023-06-15 ENCOUNTER — Telehealth: Payer: Self-pay

## 2023-06-15 NOTE — Telephone Encounter (Signed)
 LEFT VM TO SCHEDULE ORTHOTIC PICK UP. WILL BE SENDING ORTHOS TO [name]

## 2023-06-19 ENCOUNTER — Other Ambulatory Visit: Payer: Self-pay | Admitting: Podiatry

## 2023-07-20 ENCOUNTER — Other Ambulatory Visit: Payer: Self-pay | Admitting: Podiatry

## 2023-07-22 ENCOUNTER — Other Ambulatory Visit: Payer: Self-pay | Admitting: Family Medicine

## 2023-07-22 DIAGNOSIS — I10 Essential (primary) hypertension: Secondary | ICD-10-CM

## 2023-08-19 ENCOUNTER — Other Ambulatory Visit: Payer: Self-pay | Admitting: Podiatry

## 2023-09-20 ENCOUNTER — Other Ambulatory Visit: Payer: Self-pay | Admitting: Podiatry

## 2023-09-27 ENCOUNTER — Ambulatory Visit: Admitting: Podiatry

## 2023-10-06 ENCOUNTER — Telehealth: Payer: Self-pay | Admitting: Podiatry

## 2023-10-06 NOTE — Telephone Encounter (Signed)
 LVM to schedule orthotic fitting/ pu

## 2023-10-14 ENCOUNTER — Encounter: Payer: 59 | Admitting: Family Medicine

## 2023-10-23 ENCOUNTER — Other Ambulatory Visit: Payer: Self-pay | Admitting: Family Medicine

## 2023-10-23 DIAGNOSIS — I10 Essential (primary) hypertension: Secondary | ICD-10-CM

## 2023-10-24 NOTE — Telephone Encounter (Signed)
 Pt est care with PCP on 04/18/23 but then no-showed her CPE on 10/14/23, please advise if okay refilling med or not

## 2023-10-24 NOTE — Telephone Encounter (Signed)
 Please re schedule annual exam before I refill that  Thanks

## 2023-10-25 NOTE — Telephone Encounter (Signed)
 Lvmtcb, sent mychart message

## 2023-10-27 NOTE — Telephone Encounter (Signed)
 lvm for pt to call office to schedule appt.

## 2023-10-31 ENCOUNTER — Ambulatory Visit: Admitting: Family Medicine

## 2023-10-31 ENCOUNTER — Encounter: Payer: Self-pay | Admitting: Family Medicine

## 2023-10-31 ENCOUNTER — Ambulatory Visit (INDEPENDENT_AMBULATORY_CARE_PROVIDER_SITE_OTHER)
Admission: RE | Admit: 2023-10-31 | Discharge: 2023-10-31 | Disposition: A | Discharge status: Home / Self Care | Source: Ambulatory Visit | Attending: Family Medicine | Admitting: Family Medicine

## 2023-10-31 ENCOUNTER — Ambulatory Visit: Payer: Self-pay | Admitting: Family Medicine

## 2023-10-31 VITALS — BP 122/84 | HR 96 | Temp 99.7°F | Ht 63.25 in | Wt 142.4 lb

## 2023-10-31 DIAGNOSIS — M25551 Pain in right hip: Secondary | ICD-10-CM

## 2023-10-31 DIAGNOSIS — M25561 Pain in right knee: Secondary | ICD-10-CM

## 2023-10-31 NOTE — Telephone Encounter (Signed)
 FYI Only or Action Required?: FYI only for provider.  Patient was last seen in primary care on 04/18/2023 by Randeen Laine LABOR, MD.  Called Nurse Triage reporting Knee Pain.  Symptoms began a week ago.  Interventions attempted: Ice/heat application.  Symptoms are: unchanged.  Triage Disposition: See HCP Within 4 Hours (Or PCP Triage)  Patient/caregiver understands and will follow disposition?: Yes              Copied from CRM #[id number]. Topic: Clinical - Red Word Triage >> Oct 31, 2023  8:57 AM Larissa RAMAN wrote: [facility] that prompted transfer to Nurse Triage: rt knee swelling, difficulty walking x 1 week Reason for Disposition  [1] SEVERE pain (e.g., excruciating, unable to walk) AND [2] not improved after 2 hours of pain medicine  Answer Assessment - Initial Assessment Questions LOCATION: Where is the swelling located?  (e.g., left, right, both knees)     Right knee and radiating up thigh  ONSET: When did the swelling start? Does it come and go, or is it there all the time?     Last week  SWELLING: How bad is the swelling? Or, How large is it? (e.g., mild, moderate, severe; size of localized swelling)      Severe  PAIN: Is there any pain? If Yes, ask: How bad is it? (Scale 0-10; or none, mild, moderate, severe)     Tender with touch, 10/10 pain level constant  SETTING: Has there been any recent work, exercise or other activity that involved that part of the body?      No  AGGRAVATING FACTORS: What makes the knee swelling worse? (e.g., walking, climbing stairs, running)     Walking and climbing stairs  Denies redness, calf pain, chest pain, difficulty breathing  Protocols used: Knee Swelling-A-AH

## 2023-10-31 NOTE — Telephone Encounter (Signed)
 Pt r/s CPE, med refilled once

## 2023-10-31 NOTE — Assessment & Plan Note (Signed)
 Lateral hip primarily- and area from hip to knee  On exam tight IT band and tender over greater trochanter  Remote history of acetabular labrum tear and also troch bursitis   Xray normal today Will refer back to ortho

## 2023-10-31 NOTE — Assessment & Plan Note (Signed)
 Worse recently and pt feels knee is swollen Triggered by ambulating-esp stairs  Takes meloxicam and has used heat  Patellofemoral arthritis noted on xray   Encouraged trial of ice  Elevate when able   Refer back to orthopedics

## 2023-10-31 NOTE — Telephone Encounter (Signed)
 Will see patient then Agree with ER and UC precautions

## 2023-10-31 NOTE — Progress Notes (Signed)
 Subjective:    Patient ID: Denise Roth, female    DOB: [birth year], 53 y.o.   MRN: [id number]  HPI  Wt Readings from Last 3 Encounters:  10/31/23 142 lb 6 oz (64.6 kg)  04/18/23 169 lb 2 oz (76.7 kg)  11/30/22 170 lb 4 oz (77.2 kg)   25.02 kg/m  Vitals:   10/31/23 1458  BP: 122/84  Pulse: 96  Temp: 99.7 F (37.6 C)  SpO2: 98%   Pt presents for c/o  Right knee pain and hip area pain  HTN   Pain in right mid knee  Radiates up  Also lateral hip   Knee is swollen on and off   Sharp pain  Worse with stairs  (can hear it crunching)  Tenderness medially   It even hurts her for slight touch or clothing touching it   This am - stayed in bed and used a heating pad   Was dx with bursitis of hip in the past - and had a shot (ortho at [facility])   Has been given exercise  No formal physical therapy  No knee brace  No orthotics   Meloxicam 15 mg daily - is not helping very much No tylenol   Used deep blue topical product  Icy hot spray    One day had pain in groin-that is better   Had acetabular labrum tear in past ?  Did not need a hip replacement    Chart notes history of knee pain in past /bilateral   Left knee film noted mild deg change in 2021 Right knee film noted chondromalacia patella   Did see rheumatology 2021    No recent exercise  Wondered if she needed to walk more  Used to walk   Imaging today DG HIP UNILAT W OR W/O PELVIS 2-3 VIEWS RIGHT Result Date: 10/31/2023 CLINICAL DATA:  pain in lateral hip with tendenress of greater trochanter , occ pain in groin area EXAM: DG HIP (WITH OR WITHOUT PELVIS) 2-3V RIGHT COMPARISON:  None Available. FINDINGS: No evidence of fracture of the pelvis or right hip. The femoral head is normally located. Normal acetabular femoral morphology. No arthropathy. No evidence of erosion, avascular necrosis or bony destructive change. No significant trochanteric enthesopathy. Unremarkable soft tissues.  IMPRESSION: Negative radiographs of the pelvis and right hip. Electronically Signed   By: Denise Roth M.D.   On: 10/31/2023 16:07   DG Knee 4 Views W/Patella Right Result Date: 10/31/2023 CLINICAL DATA:  acute on chonic right knee pain with some swelling, worse with stairs h/o chondromalacia in past EXAM: RIGHT KNEE - COMPLETE 4+ VIEW COMPARISON:  Right knee radiograph 07/09/2019 FINDINGS: No evidence of fracture, dislocation, or joint effusion. Patellofemoral degenerative change with mild joint space narrowing, spurring and subchondral cysts. No erosive change or bony destruction. Soft tissues are unremarkable. Frontal view of the left knee included for comparison is unremarkable. IMPRESSION: Patellofemoral osteoarthritis. No acute findings. Electronically Signed   By: Denise Roth M.D.   On: 10/31/2023 16:06        HTN bp is stable today  No cp or palpitations or headaches or edema  No side effects to medicines  BP Readings from Last 3 Encounters:  10/31/23 122/84  04/18/23 106/68  11/30/22 126/74   Hydrochlorothiazide  25 mg daily  Has cpx prescription upcoming     Lab Results  Component Value Date   NA 141 11/30/2022   K 3.7 11/30/2022   CO2 28 11/30/2022  GLUCOSE 147 (H) 11/30/2022   BUN 10 11/30/2022   CREATININE 0.74 11/30/2022   CALCIUM 9.7 11/30/2022   GFR 92.97 11/30/2022   GFRNONAA >60 04/20/2019      Patient Active Problem List   Diagnosis Date Noted   Right hip pain 10/31/2023   Empty sella syndrome (HCC) 05/26/2023   Constipation 04/18/2023   Trochanteric bursitis of right hip 07/12/2022   Pain of left hip joint 06/08/2022   Acetabular labrum tear 05/28/2022   Dermatitis of both ear canals 08/18/2021   Overweight (BMI 25.0-29.9) 08/18/2021   Anxiety 07/10/2020   Encounter for counseling 06/24/2020   Hot flashes 05/19/2020   Insomnia disorder 10/30/2019   Generalized anxiety disorder 10/30/2019   Swelling of both ankles 08/17/2019   History  of gallstones 07/09/2019   Vitamin D deficiency 07/09/2019   Essential hypertension 07/09/2019   Iron deficiency anemia 04/07/2016   Right knee pain 03/23/2016   Anal fissure s/p LLsphincterotomy 01/04/2012 11/22/2011   GERD (gastroesophageal reflux disease) 11/22/2011   Anemia 11/22/2011   Past Medical History:  Diagnosis Date   Anal fissure    Anemia    History of gallstones    Hypertension    Obesity    PCOS (polycystic ovarian syndrome)    Perianal abscess    PONV (postoperative nausea and vomiting)    Past Surgical History:  Procedure Laterality Date   ANAL EXAMINATION UNDER ANESTHESIA  01/04/2012   sphincterotomy by Dr. Sheldon   BREAST REDUCTION SURGERY     CARPAL TUNNEL RELEASE     bilateral   CHOLECYSTECTOMY     COSMETIC SURGERY  09/21/17   EAR CYST EXCISION     left   FLEXIBLE SIGMOIDOSCOPY  12/01/2011   Procedure: FLEXIBLE SIGMOIDOSCOPY;  Surgeon: Lamar JONETTA Aho, MD;  Location: [facility];  Service: Endoscopy;  Laterality: N/A;   GASTRIC BYPASS     LIPOSUCTION  02/2021   NASAL SEPTUM SURGERY     TONSILLECTOMY     TUBAL LIGATION     tummy tuck     Social History   Tobacco Use   Smoking status: Never   Smokeless tobacco: Never  Vaping Use   Vaping status: Never Used  Substance Use Topics   Alcohol use: Yes    Comment: holidays   Drug use: No   Family History  Problem Relation Age of Onset   Breast cancer Paternal Aunt    Colon polyps Maternal Grandfather    Diabetes Father    Hypertension Father    Heart disease Father    Hypertension Mother    Colon cancer Neg Hx    No Known Allergies Current Outpatient Medications on File Prior to Visit  Medication Sig Dispense Refill   clotrimazole -betamethasone  (LOTRISONE ) cream APPLY 1 APPLICATION TOPICALLY ONCE DAILY 30 g 0   hydrOXYzine  (VISTARIL ) 25 MG capsule TAKE 1 CAPSULE BY MOUTH AT BEDTIME AS NEEDED FOR ANXIETY (INSOMNIA)  OR  ITCHING 90 capsule 1   Meloxicam 15 MG TBDP Take 1 capsule by mouth  daily.     omeprazole  (PRILOSEC) 20 MG capsule Take 1 capsule (20 mg total) by mouth daily. 90 capsule 1   hydrochlorothiazide  (HYDRODIURIL ) 25 MG tablet Take 1 tablet by mouth once daily 90 tablet 0   No current facility-administered medications on file prior to visit.    Review of Systems  Constitutional:  Negative for activity change, appetite change, fatigue, fever and unexpected weight change.  HENT:  Negative for congestion, ear pain, rhinorrhea,  sinus pressure and sore throat.   Eyes:  Negative for pain, redness and visual disturbance.  Respiratory:  Negative for cough, shortness of breath and wheezing.   Cardiovascular:  Negative for chest pain and palpitations.  Gastrointestinal:  Negative for abdominal pain, blood in stool, constipation and diarrhea.  Endocrine: Negative for polydipsia and polyuria.  Genitourinary:  Negative for dysuria, frequency and urgency.  Musculoskeletal:  Positive for arthralgias. Negative for back pain and myalgias.  Skin:  Negative for pallor and rash.  Allergic/Immunologic: Negative for environmental allergies.  Neurological:  Negative for dizziness, syncope and headaches.  Hematological:  Negative for adenopathy. Does not bruise/bleed easily.  Psychiatric/Behavioral:  Negative for decreased concentration and dysphoric mood. The patient is not nervous/anxious.        Objective:   Physical Exam Constitutional:      General: She is not in acute distress.    Appearance: Normal appearance. She is normal weight. She is not ill-appearing.  Eyes:     Conjunctiva/sclera: Conjunctivae normal.     Pupils: Pupils are equal, round, and reactive to light.  Cardiovascular:     Rate and Rhythm: Normal rate and regular rhythm.     Pulses: Normal pulses.     Heart sounds: Normal heart sounds. No murmur heard. Pulmonary:     Effort: Pulmonary effort is normal. No respiratory distress.     Breath sounds: No wheezing.  Musculoskeletal:     Comments: Knee  right  No  effusion  Mild swelling  No warmth to the touch  mild crepitus  ROM:  Flex  45 deg  Ext full Mcmurray-pain Bounce test -no pain  Stability: Anterior drawer-nl Lachman exam -nl  Tenderness -most over lateral joint line   Gait -favors LLE  Right hip Tender over greater trochanter Limited int and ext rotation- causes groin and lateral pain both       Skin:    Findings: No erythema or rash.  Neurological:     Mental Status: She is alert.     Deep Tendon Reflexes: Reflexes normal.  Psychiatric:        Mood and Affect: Mood normal.           Assessment & Plan:   Problem List Items Addressed This Visit       Other   Right knee pain - Primary   Worse recently and pt feels knee is swollen Triggered by ambulating-esp stairs  Takes meloxicam and has used heat  Patellofemoral arthritis noted on xray   Encouraged trial of ice  Elevate when able   Refer back to orthopedics        Relevant Orders   DG Knee 4 Views W/Patella Right (Completed)   Ambulatory referral to Orthopedic Surgery   Right hip pain   Lateral hip primarily- and area from hip to knee  On exam tight IT band and tender over greater trochanter  Remote history of acetabular labrum tear and also troch bursitis   Xray normal today Will refer back to ortho       Relevant Orders   DG HIP UNILAT W OR W/O PELVIS 2-3 VIEWS RIGHT (Completed)   Ambulatory referral to Orthopedic Surgery

## 2023-10-31 NOTE — Patient Instructions (Signed)
 Use heat and or ice on your hip and knee and see if it helps Since you have already tried heat/ ice may help decrease the swelling  Continue meloxicam also    Xrays today We will reach out with result and plan    If severe symptoms after hours/go to the ER

## 2023-11-16 ENCOUNTER — Other Ambulatory Visit: Payer: Self-pay | Admitting: Medical Genetics

## 2023-12-06 ENCOUNTER — Encounter: Payer: Self-pay | Admitting: Family Medicine

## 2023-12-06 ENCOUNTER — Ambulatory Visit (INDEPENDENT_AMBULATORY_CARE_PROVIDER_SITE_OTHER): Admitting: Family Medicine

## 2023-12-06 ENCOUNTER — Ambulatory Visit: Payer: Self-pay | Admitting: Family Medicine

## 2023-12-06 VITALS — BP 98/66 | HR 79 | Temp 98.5°F | Ht 63.5 in | Wt 145.0 lb

## 2023-12-06 DIAGNOSIS — Z79899 Other long term (current) drug therapy: Secondary | ICD-10-CM | POA: Diagnosis not present

## 2023-12-06 DIAGNOSIS — E559 Vitamin D deficiency, unspecified: Secondary | ICD-10-CM

## 2023-12-06 DIAGNOSIS — D509 Iron deficiency anemia, unspecified: Secondary | ICD-10-CM

## 2023-12-06 DIAGNOSIS — I10 Essential (primary) hypertension: Secondary | ICD-10-CM | POA: Diagnosis not present

## 2023-12-06 DIAGNOSIS — E538 Deficiency of other specified B group vitamins: Secondary | ICD-10-CM | POA: Insufficient documentation

## 2023-12-06 DIAGNOSIS — Z0000 Encounter for general adult medical examination without abnormal findings: Secondary | ICD-10-CM | POA: Diagnosis not present

## 2023-12-06 DIAGNOSIS — K219 Gastro-esophageal reflux disease without esophagitis: Secondary | ICD-10-CM

## 2023-12-06 DIAGNOSIS — R0981 Nasal congestion: Secondary | ICD-10-CM

## 2023-12-06 DIAGNOSIS — F411 Generalized anxiety disorder: Secondary | ICD-10-CM

## 2023-12-06 LAB — CBC WITH DIFFERENTIAL/PLATELET
Basophils Absolute: 0 K/uL (ref 0.0–0.1)
Basophils Relative: 0.3 % (ref 0.0–3.0)
Eosinophils Absolute: 0.3 K/uL (ref 0.0–0.7)
Eosinophils Relative: 3.9 % (ref 0.0–5.0)
HCT: 35.8 % — ABNORMAL LOW (ref 36.0–46.0)
Hemoglobin: 11.6 g/dL — ABNORMAL LOW (ref 12.0–15.0)
Lymphocytes Relative: 19.1 % (ref 12.0–46.0)
Lymphs Abs: 1.3 K/uL (ref 0.7–4.0)
MCHC: 32.4 g/dL (ref 30.0–36.0)
MCV: 84.3 fl (ref 78.0–100.0)
Monocytes Absolute: 0.7 K/uL (ref 0.1–1.0)
Monocytes Relative: 10.3 % (ref 3.0–12.0)
Neutro Abs: 4.6 K/uL (ref 1.4–7.7)
Neutrophils Relative %: 66.4 % (ref 43.0–77.0)
Platelets: 307 K/uL (ref 150.0–400.0)
RBC: 4.24 Mil/uL (ref 3.87–5.11)
RDW: 13.9 % (ref 11.5–15.5)
WBC: 6.9 K/uL (ref 4.0–10.5)

## 2023-12-06 LAB — COMPREHENSIVE METABOLIC PANEL WITH GFR
ALT: 14 U/L (ref 0–35)
AST: 14 U/L (ref 0–37)
Albumin: 4 g/dL (ref 3.5–5.2)
Alkaline Phosphatase: 84 U/L (ref 39–117)
BUN: 12 mg/dL (ref 6–23)
CO2: 32 meq/L (ref 19–32)
Calcium: 9.2 mg/dL (ref 8.4–10.5)
Chloride: 102 meq/L (ref 96–112)
Creatinine, Ser: 0.64 mg/dL (ref 0.40–1.20)
GFR: 100.83 mL/min (ref >60.00)
Glucose, Bld: 77 mg/dL (ref 70–99)
Potassium: 3.9 meq/L (ref 3.5–5.1)
Sodium: 140 meq/L (ref 135–145)
Total Bilirubin: 0.3 mg/dL (ref 0.2–1.2)
Total Protein: 6.6 g/dL (ref 6.0–8.3)

## 2023-12-06 LAB — TSH: TSH: 1.78 u[IU]/mL (ref 0.35–5.50)

## 2023-12-06 LAB — LIPID PANEL
Cholesterol: 151 mg/dL (ref 0–200)
HDL: 58.9 mg/dL (ref >39.00)
LDL Cholesterol: 78 mg/dL (ref 0–99)
NonHDL: 91.63
Total CHOL/HDL Ratio: 3
Triglycerides: 66 mg/dL (ref 0.0–149.0)
VLDL: 13.2 mg/dL (ref 0.0–40.0)

## 2023-12-06 LAB — VITAMIN D 25 HYDROXY (VIT D DEFICIENCY, FRACTURES): VITD: 18.6 ng/mL — ABNORMAL LOW (ref 30.00–100.00)

## 2023-12-06 LAB — VITAMIN B12: Vitamin B-12: 198 pg/mL — ABNORMAL LOW (ref 211–911)

## 2023-12-06 NOTE — Assessment & Plan Note (Signed)
 B12 and D with labs today   Has dexa ordered from gyn

## 2023-12-06 NOTE — Assessment & Plan Note (Signed)
 Worse on right  No fever or green nasal d/c ? If starting env allergies  Will try steroid ns over the counter and update Discussed signs and symptoms of bacterial sinusitis to watch for

## 2023-12-06 NOTE — Assessment & Plan Note (Signed)
 D level today  Encouraged 2000 international units D3 daily over the counter for bone and overall health

## 2023-12-06 NOTE — Assessment & Plan Note (Signed)
 Doing well  Has interest in mindfulness GAD7 score of 0

## 2023-12-06 NOTE — Assessment & Plan Note (Signed)
 Doing better Omeprazole  20 mg daily  Still has to avoid triggers /diet and lifestyle wise

## 2023-12-06 NOTE — Patient Instructions (Addendum)
 Get flonase or nasacort  nasal spray over the counter and use once daily as directed 2 sprays each nostril once daily  If no improvement in a week - call and let us  know   Work on getting 2000 international units of vitamin D3 every day over the counter   Get back to walking Add some strength training to your routine, this is important for bone and brain health and can reduce your risk of falls and help your body use insulin properly and regulate weight  Light weights, exercise bands , and internet videos are a good way to start  Yoga (chair or regular), machines , floor exercises or a gym with machines are also good options

## 2023-12-06 NOTE — Progress Notes (Signed)
 Subjective:    Patient ID: Denise Roth, female    DOB: 1970-08-07, 53 y.o.   MRN: [id number]  HPI  Here for health maintenance exam and to review chronic medical problems   Wt Readings from Last 3 Encounters:  12/06/23 145 lb (65.8 kg)  10/31/23 142 lb 6 oz (64.6 kg)  04/18/23 169 lb 2 oz (76.7 kg)   25.28 kg/m  Vitals:   12/06/23 1031  BP: 98/66  Pulse: 79  Temp: 98.5 F (36.9 C)  SpO2: 100%    Immunization History  Administered Date(s) Administered   DTaP 09/04/1970, 10/02/1970, 12/02/1971, 02/09/1972, 05/20/1975   IPV 08/27/1970, 09/04/1970, 04/02/1971, 02/09/1972, 05/20/1975   Influenza, Quadrivalent, Recombinant, Inj, Pf 12/09/2018   Influenza,inj,quad, With Preservative 01/03/2018, 12/09/2018   MMR 01/31/1978, 04/01/2014, 04/22/2014   PFIZER Comirnaty(Gray Top)Covid-19 Tri-Sucrose Vaccine 06/22/2019, 07/27/2019, 03/24/2020   Tdap 03/22/2014   Zoster Recombinant(Shingrix) 06/02/2020, 01/18/2021    Health Maintenance Due  Topic Date Due   Hepatitis B Vaccines 19-59 Average Risk (1 of 3 - 19+ 3-dose series) Never done   Pneumococcal Vaccine: 50+ Years (1 of 1 - PCV) Never done   Mammogram  05/15/2023   Sinus problems for over a month  Gums on right side ache  Mucous is clear  Gums on right  side hurt   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  Doing well   Has never had allergies that she knos of    Declines flu shot   Mammogram had at [facility] -calling for this  Self breast exam- no lumps   Gyn health Pap 10/2022 with 5 y recall  Sees gyn provider  Dr Rutherford    Colon cancer screening  Colonoscopy 07/2016 -10 y recall    Bone health  Dr Rutherford ordered dexa at [facility]-has not done it yet  Falls- none  Fractures-none  Supplements -none    Exercise  Not a lot  Used to walk  Has a treadmill     Mood    12/06/2023   10:34 AM 10/31/2023    3:08 PM 04/18/2023    3:42 PM 11/30/2022    2:01 PM 08/18/2021    8:47 AM   Depression screen PHQ 2/9  Decreased Interest 0 0 0 0 0  Down, Depressed, Hopeless 0 0 0 0 0  PHQ - 2 Score 0 0 0 0 0  Altered sleeping 0 0 0 0 0  Tired, decreased energy 0 0 0 0 0  Change in appetite 0 0 0 0 0  Feeling bad or failure about yourself  0 0 0 0 0  Trouble concentrating 0 0 0 0 0  Moving slowly or fidgety/restless 0 0 0 0 0  Suicidal thoughts 0 0 0 0 0  PHQ-9 Score 0 0 0 0 0  Difficult doing work/chores Not difficult at all Not difficult at all Not difficult at all Not difficult at all Not difficult at all      12/06/2023   10:34 AM 10/31/2023    3:08 PM 04/18/2023    3:42 PM 11/30/2022    2:01 PM  GAD 7 : Generalized Anxiety Score  Nervous, Anxious, on Edge 0 0 0 0  Control/stop worrying 0 0 0 0  Worry too much - different things 0 0 0 0  Trouble relaxing 0 0 0 0  Restless 0 3 0 0  Easily annoyed or irritable 0 0 0 0  Afraid - awful might happen 0  0 0 0  Total GAD 7 Score 0 3 0 0  Anxiety Difficulty Not difficult at all Not difficult at all Not difficult at all Not difficult at all     HTN bp is stable today  No cp or palpitations or headaches or edema  No side effects to medicines  BP Readings from Last 3 Encounters:  12/06/23 98/66  10/31/23 122/84  04/18/23 106/68    Hydrochlorothiazide  25 mg daily  Lab Results  Component Value Date   NA 141 11/30/2022   K 3.7 11/30/2022   CO2 28 11/30/2022   GLUCOSE 147 (H) 11/30/2022   BUN 10 11/30/2022   CREATININE 0.74 11/30/2022   CALCIUM 9.7 11/30/2022   GFR 92.97 11/30/2022   GFRNONAA >60 04/20/2019    GERD Omeprazole  20 mg daily  Lab Results  Component Value Date   VITAMINB12 603 07/26/2016   Doing better with that  Has to be mindful about eating/ habits       Patient Active Problem List   Diagnosis Date Noted   Routine general medical examination at a health care facility 12/06/2023   Current use of proton pump inhibitor 12/06/2023   Nasal congestion 12/06/2023   Right hip pain  10/31/2023   Empty sella syndrome 05/26/2023   Constipation 04/18/2023   Trochanteric bursitis of right hip 07/12/2022   Pain of left hip joint 06/08/2022   Acetabular labrum tear 05/28/2022   Dermatitis of both ear canals 08/18/2021   Overweight (BMI 25.0-29.9) 08/18/2021   Anxiety 07/10/2020   Hot flashes 05/19/2020   Insomnia disorder 10/30/2019   Generalized anxiety disorder 10/30/2019   Swelling of both ankles 08/17/2019   History of gallstones 07/09/2019   Vitamin D deficiency 07/09/2019   Essential hypertension 07/09/2019   Iron deficiency anemia 04/07/2016   Right knee pain 03/23/2016   Anal fissure s/p LLsphincterotomy 01/04/2012 11/22/2011   GERD (gastroesophageal reflux disease) 11/22/2011   Anemia 11/22/2011   Past Medical History:  Diagnosis Date   Anal fissure    Anemia    History of gallstones    Hypertension    Obesity    PCOS (polycystic ovarian syndrome)    Perianal abscess    PONV (postoperative nausea and vomiting)    Past Surgical History:  Procedure Laterality Date   ANAL EXAMINATION UNDER ANESTHESIA  01/04/2012   sphincterotomy by Dr. Sheldon   BREAST REDUCTION SURGERY     CARPAL TUNNEL RELEASE     bilateral   CHOLECYSTECTOMY     COSMETIC SURGERY  09/21/17   EAR CYST EXCISION     left   FLEXIBLE SIGMOIDOSCOPY  12/01/2011   Procedure: FLEXIBLE SIGMOIDOSCOPY;  Surgeon: Lamar JONETTA Aho, MD;  Location: [facility];  Service: Endoscopy;  Laterality: N/A;   GASTRIC BYPASS     LIPOSUCTION  02/2021   NASAL SEPTUM SURGERY     TONSILLECTOMY     TUBAL LIGATION     tummy tuck     Social History   Tobacco Use   Smoking status: Never   Smokeless tobacco: Never  Vaping Use   Vaping status: Never Used  Substance Use Topics   Alcohol use: Yes    Comment: holidays   Drug use: No   Family History  Problem Relation Age of Onset   Breast cancer Paternal Aunt    Colon polyps Maternal Grandfather    Diabetes Father    Hypertension Father    Heart  disease Father    Hypertension Mother  Colon cancer Neg Hx    No Known Allergies Current Outpatient Medications on File Prior to Visit  Medication Sig Dispense Refill   clotrimazole -betamethasone  (LOTRISONE ) cream APPLY 1 APPLICATION TOPICALLY ONCE DAILY 30 g 0   hydrochlorothiazide  (HYDRODIURIL ) 25 MG tablet Take 1 tablet by mouth once daily 90 tablet 0   hydrOXYzine  (VISTARIL ) 25 MG capsule TAKE 1 CAPSULE BY MOUTH AT BEDTIME AS NEEDED FOR ANXIETY (INSOMNIA)  OR  ITCHING 90 capsule 1   Meloxicam 15 MG TBDP Take 1 capsule by mouth daily.     omeprazole  (PRILOSEC) 20 MG capsule Take 1 capsule (20 mg total) by mouth daily. 90 capsule 1   No current facility-administered medications on file prior to visit.    Review of Systems  Constitutional:  Negative for activity change, appetite change, fatigue, fever and unexpected weight change.  HENT:  Positive for congestion and sinus pressure. Negative for ear pain, rhinorrhea, sinus pain and sore throat.   Eyes:  Negative for pain, redness and visual disturbance.  Respiratory:  Negative for cough, shortness of breath and wheezing.   Cardiovascular:  Negative for chest pain and palpitations.  Gastrointestinal:  Negative for abdominal pain, blood in stool, constipation and diarrhea.  Endocrine: Negative for polydipsia and polyuria.  Genitourinary:  Negative for dysuria, frequency and urgency.  Musculoskeletal:  Negative for arthralgias, back pain and myalgias.  Skin:  Negative for pallor and rash.  Allergic/Immunologic: Negative for environmental allergies.  Neurological:  Negative for dizziness, syncope and headaches.  Hematological:  Negative for adenopathy. Does not bruise/bleed easily.  Psychiatric/Behavioral:  Negative for decreased concentration and dysphoric mood. The patient is not nervous/anxious.        Objective:   Physical Exam Constitutional:      General: She is not in acute distress.    Appearance: Normal appearance. She is  well-developed and normal weight. She is not ill-appearing or diaphoretic.  HENT:     Head: Normocephalic and atraumatic.     Right Ear: Tympanic membrane, ear canal and external ear normal.     Left Ear: Tympanic membrane, ear canal and external ear normal.     Nose: Congestion and rhinorrhea present.     Comments: Nares are boggy and pale   Worse on right     Mouth/Throat:     Mouth: Mucous membranes are moist.     Pharynx: Oropharynx is clear. No posterior oropharyngeal erythema.  Eyes:     General: No scleral icterus.    Extraocular Movements: Extraocular movements intact.     Conjunctiva/sclera: Conjunctivae normal.     Pupils: Pupils are equal, round, and reactive to light.  Neck:     Thyroid : No thyromegaly.     Vascular: No carotid bruit or JVD.  Cardiovascular:     Rate and Rhythm: Normal rate and regular rhythm.     Pulses: Normal pulses.     Heart sounds: Normal heart sounds.     No gallop.  Pulmonary:     Effort: Pulmonary effort is normal. No respiratory distress.     Breath sounds: Normal breath sounds. No wheezing.     Comments: Good air exch Chest:     Chest wall: No tenderness.  Abdominal:     General: Bowel sounds are normal. There is no distension or abdominal bruit.     Palpations: Abdomen is soft. There is no mass.     Tenderness: There is no abdominal tenderness.     Hernia: No hernia is present.  Genitourinary:    Comments: Breast and pelvic exam are done by gyn provider   Musculoskeletal:        General: No tenderness. Normal range of motion.     Cervical back: Normal range of motion and neck supple. No rigidity. No muscular tenderness.     Right lower leg: No edema.     Left lower leg: No edema.     Comments: No kyphosis   Lymphadenopathy:     Cervical: No cervical adenopathy.  Skin:    General: Skin is warm and dry.     Coloration: Skin is not pale.     Findings: No erythema or rash.     Comments: Few skin tags   Neurological:     Mental  Status: She is alert. Mental status is at baseline.     Cranial Nerves: No cranial nerve deficit.     Motor: No abnormal muscle tone.     Coordination: Coordination normal.     Gait: Gait normal.     Deep Tendon Reflexes: Reflexes are normal and symmetric. Reflexes normal.  Psychiatric:        Mood and Affect: Mood normal.        Cognition and Memory: Cognition and memory normal.           Assessment & Plan:   Problem List Items Addressed This Visit       Cardiovascular and Mediastinum   Essential hypertension   bp in fair control at this time  BP Readings from Last 1 Encounters:  12/06/23 98/66   No changes needed Most recent labs reviewed  Disc lifstyle change with low sodium diet and exercise  Not light headed  Continues hydrochlorothiazide  25 mg daily  Lab today  Refill if labs are ok       Relevant Orders   TSH   Lipid panel   Comprehensive metabolic panel with GFR   CBC with Differential/Platelet     Digestive   GERD (gastroesophageal reflux disease)   Doing better Omeprazole  20 mg daily  Still has to avoid triggers /diet and lifestyle wise          Other   Vitamin D deficiency   D level today  Encouraged 2000 international units D3 daily over the counter for bone and overall health       Relevant Orders   VITAMIN D 25 Hydroxy (Vit-D Deficiency, Fractures)   Routine general medical examination at a health care facility - Primary   Reviewed health habits including diet and exercise and skin cancer prevention Reviewed appropriate screening tests for age  Also reviewed health mt list, fam hx and immunization status , as well as social and family history   See HPI Labs reviewed and ordered Health Maintenance  Topic Date Due   Hepatitis B Vaccine (1 of 3 - 19+ 3-dose series) Never done   Pneumococcal Vaccine for age over 100 (1 of 1 - PCV) Never done   Breast Cancer Screening  05/15/2023   Flu Shot  06/05/2024*   COVID-19 Vaccine (4 - 2025-26  season) 12/21/2025*   DTaP/Tdap/Td vaccine (6 - Td or Tdap) 03/22/2024   Colon Cancer Screening  07/29/2026   Pap with HPV screening  10/22/2027   Hepatitis C Screening  Completed   HIV Screening  Completed   Zoster (Shingles) Vaccine  Completed   HPV Vaccine  Aged Out   Meningitis B Vaccine  Aged Out  *Topic was postponed. The date shown is not  the original due date.    Declines flu shot  Sent for mammogram report from [facility] Under gyn care Discussed fall prevention, supplements and exercise for bone density  Planning dexa with her gynecologist  PHQ 0  Encouraged to add exercise  Lab today      Nasal congestion   Worse on right  No fever or green nasal d/c ? If starting env allergies  Will try steroid ns over the counter and update Discussed signs and symptoms of bacterial sinusitis to watch for       Generalized anxiety disorder   Doing well  Has interest in mindfulness GAD7 score of 0      Current use of proton pump inhibitor   B12 and D with labs today   Has dexa ordered from gyn      Relevant Orders   Vitamin B12

## 2023-12-06 NOTE — Assessment & Plan Note (Signed)
 Reviewed health habits including diet and exercise and skin cancer prevention Reviewed appropriate screening tests for age  Also reviewed health mt list, fam hx and immunization status , as well as social and family history   See HPI Labs reviewed and ordered Health Maintenance  Topic Date Due   Hepatitis B Vaccine (1 of 3 - 19+ 3-dose series) Never done   Pneumococcal Vaccine for age over 12 (1 of 1 - PCV) Never done   Breast Cancer Screening  05/15/2023   Flu Shot  06/05/2024*   COVID-19 Vaccine (4 - 2025-26 season) 12/21/2025*   DTaP/Tdap/Td vaccine (6 - Td or Tdap) 03/22/2024   Colon Cancer Screening  07/29/2026   Pap with HPV screening  10/22/2027   Hepatitis C Screening  Completed   HIV Screening  Completed   Zoster (Shingles) Vaccine  Completed   HPV Vaccine  Aged Out   Meningitis B Vaccine  Aged Out  *Topic was postponed. The date shown is not the original due date.    Declines flu shot  Sent for mammogram report from [facility] Discussed fall prevention, supplements and exercise for bone density  Planning dexa with her gynecologist  PHQ 0  Encouraged to add exercise  Lab today

## 2023-12-06 NOTE — Assessment & Plan Note (Signed)
 bp in fair control at this time  BP Readings from Last 1 Encounters:  12/06/23 98/66   No changes needed Most recent labs reviewed  Disc lifstyle change with low sodium diet and exercise  Not light headed  Continues hydrochlorothiazide  25 mg daily  Lab today  Refill if labs are ok

## 2023-12-08 ENCOUNTER — Encounter: Payer: Self-pay | Admitting: *Deleted

## 2023-12-09 NOTE — Telephone Encounter (Signed)
 Copied from CRM [id number]. Topic: General - Other >> Dec 09, 2023 10:51 AM Robinson H wrote: Reason for CRM: Patient returning call to Kelli at office, agent scheduled patient lab appointment for 11/3 at 7:45 am. Patient also states she does not take any iron supplements.  Joline [phone]

## 2023-12-13 ENCOUNTER — Ambulatory Visit (INDEPENDENT_AMBULATORY_CARE_PROVIDER_SITE_OTHER)

## 2023-12-13 DIAGNOSIS — E538 Deficiency of other specified B group vitamins: Secondary | ICD-10-CM | POA: Diagnosis not present

## 2023-12-13 MED ORDER — CYANOCOBALAMIN 1000 MCG/ML IJ SOLN
1000.0000 ug | Freq: Once | INTRAMUSCULAR | Status: AC
  Administered 2023-12-13: 1000 ug via INTRAMUSCULAR

## 2023-12-13 NOTE — Progress Notes (Signed)
 Per orders of Dr. Roxy Manns, injection of B-12 given by Melina Copa in left deltoid. Patient tolerated injection well. Patient will make appointment for 1 month.

## 2023-12-23 ENCOUNTER — Other Ambulatory Visit: Payer: Self-pay | Admitting: Radiology

## 2023-12-23 ENCOUNTER — Other Ambulatory Visit: Payer: Self-pay

## 2023-12-23 ENCOUNTER — Other Ambulatory Visit: Payer: Self-pay | Admitting: *Deleted

## 2023-12-23 DIAGNOSIS — D509 Iron deficiency anemia, unspecified: Secondary | ICD-10-CM

## 2023-12-23 DIAGNOSIS — E559 Vitamin D deficiency, unspecified: Secondary | ICD-10-CM

## 2023-12-23 DIAGNOSIS — E538 Deficiency of other specified B group vitamins: Secondary | ICD-10-CM

## 2023-12-23 DIAGNOSIS — K219 Gastro-esophageal reflux disease without esophagitis: Secondary | ICD-10-CM

## 2023-12-27 ENCOUNTER — Ambulatory Visit: Payer: Self-pay | Admitting: Family Medicine

## 2023-12-27 LAB — HEMOCCULT SLIDES (X 3 CARDS)
Fecal Occult Blood: NEGATIVE
OCCULT 1: NEGATIVE
OCCULT 2: NEGATIVE
OCCULT 3: NEGATIVE
OCCULT 4: NEGATIVE
OCCULT 5: NEGATIVE

## 2024-01-09 ENCOUNTER — Other Ambulatory Visit

## 2024-01-22 ENCOUNTER — Other Ambulatory Visit: Payer: Self-pay | Admitting: Family Medicine

## 2024-01-22 DIAGNOSIS — I10 Essential (primary) hypertension: Secondary | ICD-10-CM

## 2024-01-26 ENCOUNTER — Other Ambulatory Visit

## 2024-01-26 DIAGNOSIS — D509 Iron deficiency anemia, unspecified: Secondary | ICD-10-CM | POA: Diagnosis not present

## 2024-01-26 DIAGNOSIS — E559 Vitamin D deficiency, unspecified: Secondary | ICD-10-CM

## 2024-01-26 DIAGNOSIS — E538 Deficiency of other specified B group vitamins: Secondary | ICD-10-CM | POA: Diagnosis not present

## 2024-01-26 LAB — VITAMIN B12: Vitamin B-12: 437 pg/mL (ref 211–911)

## 2024-01-26 LAB — CBC WITH DIFFERENTIAL/PLATELET
Basophils Absolute: 0.2 K/uL — ABNORMAL HIGH (ref 0.0–0.1)
Basophils Relative: 3.1 % — ABNORMAL HIGH (ref 0.0–3.0)
Eosinophils Absolute: 0.3 K/uL (ref 0.0–0.7)
Eosinophils Relative: 5.3 % — ABNORMAL HIGH (ref 0.0–5.0)
HCT: 35.8 % — ABNORMAL LOW (ref 36.0–46.0)
Hemoglobin: 11.6 g/dL — ABNORMAL LOW (ref 12.0–15.0)
Lymphocytes Relative: 24 % (ref 12.0–46.0)
Lymphs Abs: 1.4 K/uL (ref 0.7–4.0)
MCHC: 32.4 g/dL (ref 30.0–36.0)
MCV: 84.9 fl (ref 78.0–100.0)
Monocytes Absolute: 0.6 K/uL (ref 0.1–1.0)
Monocytes Relative: 9.8 % (ref 3.0–12.0)
Neutro Abs: 3.4 K/uL (ref 1.4–7.7)
Neutrophils Relative %: 57.8 % (ref 43.0–77.0)
Platelets: 286 K/uL (ref 150.0–400.0)
RBC: 4.22 Mil/uL (ref 3.87–5.11)
RDW: 14.3 % (ref 11.5–15.5)
WBC: 5.8 K/uL (ref 4.0–10.5)

## 2024-01-26 LAB — VITAMIN D 25 HYDROXY (VIT D DEFICIENCY, FRACTURES): VITD: 32.49 ng/mL (ref 30.00–100.00)

## 2024-01-26 LAB — FERRITIN: Ferritin: 155.3 ng/mL (ref 10.0–291.0)

## 2024-01-26 LAB — IRON: Iron: 68 ug/dL (ref 42–145)

## 2024-01-27 ENCOUNTER — Ambulatory Visit: Payer: Self-pay | Admitting: Family Medicine

## 2024-01-27 ENCOUNTER — Other Ambulatory Visit

## 2024-02-06 ENCOUNTER — Encounter: Payer: Self-pay | Admitting: Family Medicine

## 2024-02-06 ENCOUNTER — Ambulatory Visit: Admitting: Family Medicine

## 2024-02-06 VITALS — BP 118/78 | HR 91 | Temp 98.6°F | Ht 63.5 in | Wt 139.5 lb

## 2024-02-06 DIAGNOSIS — F430 Acute stress reaction: Secondary | ICD-10-CM | POA: Diagnosis not present

## 2024-02-06 DIAGNOSIS — F411 Generalized anxiety disorder: Secondary | ICD-10-CM | POA: Diagnosis not present

## 2024-02-06 MED ORDER — ESCITALOPRAM OXALATE 10 MG PO TABS: 10.0000 mg | ORAL_TABLET | Freq: Every day | ORAL | 0 refills | Status: DC

## 2024-02-06 NOTE — Patient Instructions (Addendum)
 Take care of yourself   Keep your upcoming counseling appointment  I will also refer for  new counseling  I put the referral in for mental health therapy  Please let us  know if you don't hear in 1-2 weeks to set that up (mychart message or call or letter)   You can try hydroxyzine  for sleep   Start lexapro as planned  If any intolerable side effects or if you feel worse -hold the medicine and call   If you ever feel you could harm yourself-seek care immediately  [facility] [address] [place], [place] [address]  [phone]  Stay out of work 2 weeks   Follow up in 2 weeks
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
## 2024-02-20 ENCOUNTER — Ambulatory Visit: Admitting: Family Medicine

## 2024-02-20 ENCOUNTER — Encounter: Payer: Self-pay | Admitting: Family Medicine

## 2024-02-20 VITALS — BP 130/80 | HR 84 | Temp 98.8°F | Ht 63.5 in | Wt 136.2 lb

## 2024-02-20 DIAGNOSIS — F430 Acute stress reaction: Secondary | ICD-10-CM

## 2024-02-20 DIAGNOSIS — F411 Generalized anxiety disorder: Secondary | ICD-10-CM

## 2024-02-20 MED ORDER — ESCITALOPRAM OXALATE 20 MG PO TABS: 20.0000 mg | ORAL_TABLET | Freq: Every day | ORAL | 1 refills | Status: AC
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
## 2024-02-20 NOTE — Assessment & Plan Note (Signed)
 Due to work situation  See a/p for GAD   Some depressive symptoms as well
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
## 2024-02-20 NOTE — Patient Instructions (Addendum)
 Lexapro  - let's go up to 20 mg daily  If any side effects or you feel worse , stop it and let us  know   If you feel you could harm yourself - call the # to get [facility], let us  know , or get ot ER if needed    Keep in contact with your therapist  Start with your new therapist   Make a goal of 15 minute walk daily  Outdoors if possible   Follow up with us  in approx 2 weeks or earlier if we need to do paperwork

## 2024-02-28 ENCOUNTER — Telehealth: Payer: Self-pay

## 2024-02-28 ENCOUNTER — Encounter: Payer: Self-pay | Admitting: Family Medicine

## 2024-02-28 ENCOUNTER — Ambulatory Visit: Admitting: Family Medicine

## 2024-02-28 VITALS — BP 121/72 | HR 82 | Temp 98.9°F | Ht 63.5 in | Wt 137.0 lb

## 2024-02-28 DIAGNOSIS — F430 Acute stress reaction: Secondary | ICD-10-CM | POA: Diagnosis not present
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
## 2024-02-28 NOTE — Telephone Encounter (Signed)
 FMLA forms received already completed by provider.  Forms faxed to [employer] at [phone]  Copy sent to scan  Copy left for patient to pick up at the front desk.  Pt notified via MyChart copy was ready to pick up

## 2024-02-28 NOTE — Patient Instructions (Signed)
 Change follow up to mind January   See counselor as planned  If any issues with the medicines let us  know  Take care of yourself

## 2024-02-29 ENCOUNTER — Encounter: Payer: Self-pay | Admitting: Licensed Clinical Social Worker

## 2024-02-29 ENCOUNTER — Ambulatory Visit: Admitting: Licensed Clinical Social Worker

## 2024-02-29 DIAGNOSIS — F4323 Adjustment disorder with mixed anxiety and depressed mood: Secondary | ICD-10-CM | POA: Diagnosis not present
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
## 2024-03-05 ENCOUNTER — Ambulatory Visit: Admitting: Family Medicine

## 2024-03-13 ENCOUNTER — Other Ambulatory Visit: Payer: Self-pay | Admitting: Medical Genetics

## 2024-03-13 DIAGNOSIS — Z006 Encounter for examination for normal comparison and control in clinical research program: Secondary | ICD-10-CM

## 2024-03-17 ENCOUNTER — Other Ambulatory Visit: Payer: Self-pay | Admitting: Family Medicine

## 2024-03-19 ENCOUNTER — Encounter: Payer: Self-pay | Admitting: Family Medicine

## 2024-03-19 ENCOUNTER — Ambulatory Visit: Admitting: Family Medicine

## 2024-03-19 VITALS — BP 126/84 | HR 85 | Temp 98.6°F | Ht 63.5 in | Wt 139.5 lb

## 2024-03-19 DIAGNOSIS — F418 Other specified anxiety disorders: Secondary | ICD-10-CM | POA: Diagnosis not present

## 2024-03-19 DIAGNOSIS — F430 Acute stress reaction: Secondary | ICD-10-CM | POA: Diagnosis not present

## 2024-03-19 DIAGNOSIS — R058 Other specified cough: Secondary | ICD-10-CM | POA: Diagnosis not present

## 2024-03-19 MED ORDER — BENZONATATE 200 MG PO CAPS: 200.0000 mg | ORAL_CAPSULE | Freq: Three times a day (TID) | ORAL | 1 refills | Status: AC | PRN

## 2024-03-19 MED ORDER — HYDROXYZINE PAMOATE 25 MG PO CAPS: 25.0000 mg | ORAL_CAPSULE | Freq: Every evening | ORAL | 2 refills | Status: DC | PRN

## 2024-03-19 MED ORDER — HYDROXYZINE PAMOATE 25 MG PO CAPS: 25.0000 mg | ORAL_CAPSULE | Freq: Every evening | ORAL | 2 refills | Status: AC | PRN

## 2024-03-19 NOTE — Patient Instructions (Addendum)
 I did your note to return to work on 1/20  If you want to do intermittent FMLA - let us  know    Take care of yourself  Try to do some thinks for yourself   Continue current medications   Mucinex DM is my favorite thing over the counter for congested cough  Try the tessalon  pearles also    Update if not starting to improve in a week or if worsening

## 2024-03-19 NOTE — Assessment & Plan Note (Addendum)
 Improved with time off and therapy  Doing well with new counselor  Continues lexapro  20 mg daily  Hydroxyzine  at bedtime 25 mg  Tolerates well  Wants to return to work 1/20 if able  Note done  Will likely need to re do her FMLA paperwork May in future need intermittent FMLA for time off for therapy/etc   PHQ still elevated but pt voices feeling quite a bit better

## 2024-03-19 NOTE — Progress Notes (Signed)
 "  Subjective:    Patient ID: Denise Roth, female    DOB: 1970-05-26, 54 y.o.   MRN: [id number]  HPI  Wt Readings from Last 3 Encounters:  03/19/24 139 lb 8 oz (63.3 kg)  02/28/24 137 lb (62.1 kg)  02/20/24 136 lb 4 oz (61.8 kg)   24.32 kg/m  Vitals:   03/19/24 0803  BP: 126/84  Pulse: 85  Temp: 98.6 F (37 C)  SpO2: 100%     Pt presents for follow up of mood GAD Stress reaction  Cold symptoms    Lexapro  20 mg daily  Hydroxyzine  prn   Overall feeling better for the first time in a while   Seeing her new therapist Meeting weekly Self thinking Actualizing  Showing gratitude and appreciation    Wants to try to return to work on 1/20  With the intention of starting fresh in her current position  Will see how that will work out  Will be away from the toxic environment and accepting /seeing positive benefits   Appetite and sleep are the same  Wants to regulate that   Self care She polished her finger nails- had not done that in a while     Cymbalta and fluoxetine  are on chart from 2020 and 2022 Trazodone  2022      03/19/2024    8:08 AM 02/28/2024    8:07 AM 02/20/2024    8:27 AM 02/06/2024    2:56 PM 12/06/2023   10:34 AM  Depression screen PHQ 2/9  Decreased Interest 2 1 3 1  0  Down, Depressed, Hopeless 2 1 3 1  0  PHQ - 2 Score 4 2 6 2  0  Altered sleeping 2 1 3 1  0  Tired, decreased energy 2 1 3 1  0  Change in appetite 2 1 3 1  0  Feeling bad or failure about yourself  2 1 3 1  0  Trouble concentrating 2 1 3 1  0  Moving slowly or fidgety/restless 2 1 1 1  0  Suicidal thoughts 0 0 0 0 0  PHQ-9 Score 16 8 22 8  0   Difficult doing work/chores Somewhat difficult Extremely dIfficult Extremely dIfficult Extremely dIfficult Not difficult at all     Data saved with a previous flowsheet row definition      03/19/2024    8:08 AM 02/28/2024    8:07 AM 02/20/2024    8:28 AM 02/06/2024    2:56 PM  GAD 7 : Generalized Anxiety Score  Nervous, Anxious, on  Edge 2 1 3 1   Control/stop worrying 2 1 3 1   Worry too much - different things 2 1 3 1   Trouble relaxing 2 1 3 1   Restless 2 1 3 1   Easily annoyed or irritable 2 1 3 1   Afraid - awful might happen 1 0 3 1  Total GAD 7 Score 13 6 21 7   Anxiety Difficulty Somewhat difficult Extremely difficult Extremely difficult Extremely difficult    Wants to stay on current medication   Had a cold  Some chest congestion  Bad cough at night    Patient Active Problem List   Diagnosis Date Noted   Productive cough 03/19/2024   Stress reaction 02/06/2024   Routine general medical examination at a health care facility 12/06/2023   Current use of proton pump inhibitor 12/06/2023   Nasal congestion 12/06/2023   B12 deficiency 12/06/2023   Right hip pain 10/31/2023   Empty sella syndrome 05/26/2023   Constipation 04/18/2023  Trochanteric bursitis of right hip 07/12/2022   Pain of left hip joint 06/08/2022   Acetabular labrum tear 05/28/2022   Dermatitis of both ear canals 08/18/2021   Overweight (BMI 25.0-29.9) 08/18/2021   Hot flashes 05/19/2020   Insomnia disorder 10/30/2019   Anxiety with depression 10/30/2019   Swelling of both ankles 08/17/2019   History of gallstones 07/09/2019   Vitamin D  deficiency 07/09/2019   Essential hypertension 07/09/2019   Iron deficiency anemia 04/07/2016   Right knee pain 03/23/2016   Anal fissure s/p LLsphincterotomy 01/04/2012 11/22/2011   GERD (gastroesophageal reflux disease) 11/22/2011   Anemia 11/22/2011   Past Medical History:  Diagnosis Date   Anal fissure    Anemia    History of gallstones    Hypertension    Obesity    PCOS (polycystic ovarian syndrome)    Perianal abscess    PONV (postoperative nausea and vomiting)    Past Surgical History:  Procedure Laterality Date   ANAL EXAMINATION UNDER ANESTHESIA  01/04/2012   sphincterotomy by Dr. Sheldon   BREAST REDUCTION SURGERY     CARPAL TUNNEL RELEASE     bilateral   CHOLECYSTECTOMY      COSMETIC SURGERY  09/21/17   EAR CYST EXCISION     left   FLEXIBLE SIGMOIDOSCOPY  12/01/2011   Procedure: FLEXIBLE SIGMOIDOSCOPY;  Surgeon: Lamar JONETTA Aho, MD;  Location: [facility];  Service: Endoscopy;  Laterality: N/A;   GASTRIC BYPASS     LIPOSUCTION  02/2021   NASAL SEPTUM SURGERY     TONSILLECTOMY     TUBAL LIGATION     tummy tuck     Social History[1] Family History  Problem Relation Age of Onset   Breast cancer Paternal Aunt    Colon polyps Maternal Grandfather    Diabetes Father    Hypertension Father    Heart disease Father    Hypertension Mother    Colon cancer Neg Hx    Allergies[2] Medications Ordered Prior to Encounter[3]  Review of Systems  Constitutional:  Negative for activity change, appetite change, fatigue, fever and unexpected weight change.  HENT:  Negative for congestion, rhinorrhea, sore throat and trouble swallowing.   Eyes:  Negative for pain, redness, itching and visual disturbance.  Respiratory:  Positive for cough. Negative for chest tightness, shortness of breath and wheezing.   Cardiovascular:  Negative for chest pain and palpitations.  Gastrointestinal:  Negative for abdominal pain, blood in stool, constipation, diarrhea and nausea.  Endocrine: Negative for cold intolerance, heat intolerance, polydipsia and polyuria.  Genitourinary:  Negative for difficulty urinating, dysuria, frequency and urgency.  Musculoskeletal:  Negative for arthralgias, joint swelling and myalgias.  Skin:  Negative for pallor and rash.  Neurological:  Negative for dizziness, tremors, weakness, numbness and headaches.  Hematological:  Negative for adenopathy. Does not bruise/bleed easily.  Psychiatric/Behavioral:  Positive for dysphoric mood and sleep disturbance. Negative for decreased concentration, self-injury and suicidal ideas. The patient is nervous/anxious.        Objective:   Physical Exam Constitutional:      General: She is not in acute distress.     Appearance: Normal appearance. She is well-developed and normal weight. She is not ill-appearing.  HENT:     Head: Normocephalic and atraumatic.  Eyes:     Conjunctiva/sclera: Conjunctivae normal.     Pupils: Pupils are equal, round, and reactive to light.  Neck:     Thyroid : No thyromegaly.     Vascular: No carotid bruit or JVD.  Cardiovascular:     Rate and Rhythm: Normal rate and regular rhythm.     Heart sounds: Normal heart sounds.     No gallop.  Pulmonary:     Effort: Pulmonary effort is normal. No respiratory distress.     Breath sounds: Normal breath sounds. No stridor. No wheezing, rhonchi or rales.     Comments: Good air exch Abdominal:     General: There is no abdominal bruit.     Palpations: Abdomen is soft.  Skin:    General: Skin is warm and dry.     Coloration: Skin is not pale.  Neurological:     Mental Status: She is alert.     Coordination: Coordination normal.     Deep Tendon Reflexes: Reflexes are normal and symmetric.     Comments: No tremor   Psychiatric:        Mood and Affect: Mood normal.           Assessment & Plan:   Problem List Items Addressed This Visit       Other   Stress reaction   Improved with time off and therapy  Doing well with new counselor  Continues lexapro  20 mg daily  Hydroxyzine  at bedtime 25 mg  Tolerates well  Wants to return to work 1/20 if able  Note done  Will likely need to re do her FMLA paperwork May in future need intermittent FMLA for time off for therapy/etc   PHQ still elevated but pt voices feeling quite a bit better      Relevant Medications   hydrOXYzine  (VISTARIL ) 25 MG capsule   Productive cough   S/p uri  Reassuring exam No wheeze or shortness of breath   Recommend expectorant plus DM over the counter as tolerated   Sent tessalon  pearles for cough as well Update if not starting to improve in a week or if worsening  Call back and Er precautions noted in detail today        Anxiety  with depression - Primary   As part of adjustment reaction  Stress/ emotional trauma   Doing better with current therapy and medication  Lexapro  20 mg daily  Hydroxyzine  25 mg at bedtime   Appetite/sleep the same but mood is better  More motivation  Affect is improved despite Resurrecc[place] PHQ and GAD 7 scores   Plan to continue current therapy and medication  Wants to return to work 1/20 -in a position out of the previous traumatic environment and pt is hopeful this will work out   Encouraged good self care   Note written for work return  Will likely have to re do her FMLA to change return date and also ? Intermittent leave for counseling       Relevant Medications   hydrOXYzine  (VISTARIL ) 25 MG capsule      [1]  Social History Tobacco Use   Smoking status: Never   Smokeless tobacco: Never  Vaping Use   Vaping status: Never Used  Substance Use Topics   Alcohol use: Yes    Comment: holidays   Drug use: No  [2] No Known Allergies [3]  Current Outpatient Medications on File Prior to Visit  Medication Sig Dispense Refill   clotrimazole -betamethasone  (LOTRISONE ) cream APPLY 1 APPLICATION TOPICALLY ONCE DAILY 30 g 0   diclofenac  (VOLTAREN ) 75 MG EC tablet Take 75 mg by mouth daily as needed.     escitalopram  (LEXAPRO ) 20 MG tablet Take 1 tablet (20 mg total) by  mouth daily. 90 tablet 1   hydrochlorothiazide  (HYDRODIURIL ) 25 MG tablet Take 1 tablet by mouth once daily 90 tablet 2   omeprazole  (PRILOSEC) 20 MG capsule Take 1 capsule (20 mg total) by mouth daily. 90 capsule 1   No current facility-administered medications on file prior to visit.   "

## 2024-03-19 NOTE — Assessment & Plan Note (Signed)
 S/p uri  Reassuring exam No wheeze or shortness of breath   Recommend expectorant plus DM over the counter as tolerated   Sent tessalon  pearles for cough as well Update if not starting to improve in a week or if worsening  Call back and Er precautions noted in detail today

## 2024-03-19 NOTE — Assessment & Plan Note (Signed)
 As part of adjustment reaction  Stress/ emotional trauma   Doing better with current therapy and medication  Lexapro  20 mg daily  Hydroxyzine  25 mg at bedtime   Appetite/sleep the same but mood is better  More motivation  Affect is improved despite [place] PHQ and GAD 7 scores   Plan to continue current therapy and medication  Wants to return to work 1/20 -in a position out of the previous traumatic environment and pt is hopeful this will work out   Encouraged good self care   Note written for work return  Will likely have to re do her FMLA to change return date and also ? Intermittent leave for counseling
